# Patient Record
Sex: Male | Born: 1955 | Race: White | Hispanic: No | Marital: Married | Smoking: Never smoker
Health system: Southern US, Community
[De-identification: ages and names within clinical notes are randomized; demographics above are authoritative.]

## PROBLEM LIST (undated history)

## (undated) DIAGNOSIS — I10 Essential (primary) hypertension: Secondary | ICD-10-CM

## (undated) DIAGNOSIS — F32A Depression, unspecified: Secondary | ICD-10-CM

## (undated) DIAGNOSIS — E079 Disorder of thyroid, unspecified: Secondary | ICD-10-CM

## (undated) DIAGNOSIS — E785 Hyperlipidemia, unspecified: Secondary | ICD-10-CM

## (undated) DIAGNOSIS — F329 Major depressive disorder, single episode, unspecified: Secondary | ICD-10-CM

## (undated) HISTORY — PX: THUMB AMPUTATION: SHX804

## (undated) HISTORY — DX: Depression, unspecified: F32.A

## (undated) HISTORY — DX: Major depressive disorder, single episode, unspecified: F32.9

## (undated) HISTORY — DX: Essential (primary) hypertension: I10

## (undated) HISTORY — DX: Hyperlipidemia, unspecified: E78.5

## (undated) HISTORY — DX: Disorder of thyroid, unspecified: E07.9

---
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
## 2009-03-27 DIAGNOSIS — E785 Hyperlipidemia, unspecified: Secondary | ICD-10-CM | POA: Insufficient documentation

## 2009-03-27 DIAGNOSIS — F32A Depression, unspecified: Secondary | ICD-10-CM | POA: Insufficient documentation

## 2009-03-27 DIAGNOSIS — E782 Mixed hyperlipidemia: Secondary | ICD-10-CM | POA: Insufficient documentation

## 2010-05-31 HISTORY — PX: PROSTATE SURGERY: SHX751

## 2011-02-16 ENCOUNTER — Ambulatory Visit: Payer: Self-pay | Admitting: Urology

## 2011-02-17 ENCOUNTER — Ambulatory Visit: Payer: Self-pay | Admitting: Urology

## 2011-02-18 LAB — PATHOLOGY REPORT

## 2011-12-27 ENCOUNTER — Ambulatory Visit: Payer: Self-pay | Admitting: Otolaryngology

## 2011-12-29 LAB — PATHOLOGY REPORT

## 2012-09-13 ENCOUNTER — Ambulatory Visit: Payer: Self-pay | Admitting: Family Medicine

## 2012-09-13 LAB — HM HEPATITIS C SCREENING LAB: HM HEPATITIS C SCREENING: NEGATIVE

## 2013-06-04 ENCOUNTER — Ambulatory Visit: Payer: Self-pay | Admitting: Gastroenterology

## 2013-12-06 IMAGING — CT CT NECK WITH CONTRAST
1 of 2 series · 9 of 14 positions shown, 12 images · IV contrast (isovue)
Comparison: none

REASON FOR EXAM: Left Parotid  Mass   With FNA
COMMENTS:

PROCEDURE:     CT  - CT NECK WITH CONTRAST  - December 27, 2011  [DATE]
RESULT:     Comparison: None.
TECHNIQUE: Multiple axial images were obtained of the neck from the level of
the frontal sinuses through the aortic arch, after the administration of 75
mL Isovue 300 intravenous contrast.

[Series 2: soft tissue · axial · 0.50mm/px · z∈[-295,-37]mm · 9 of 108 slices shown, 12 images]
[im 11/108  soft-tissue]
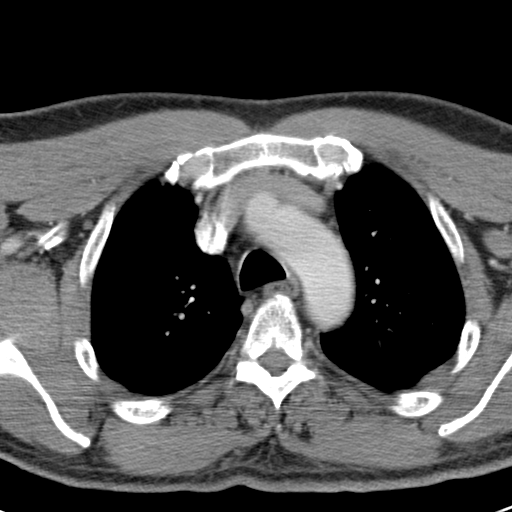
[im 11/108  bone]
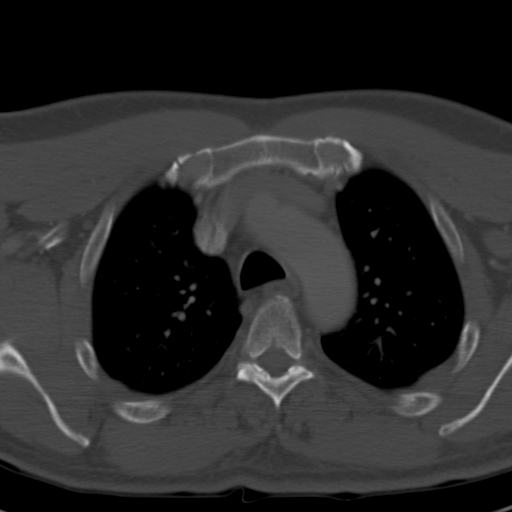
[im 22/108  bone]
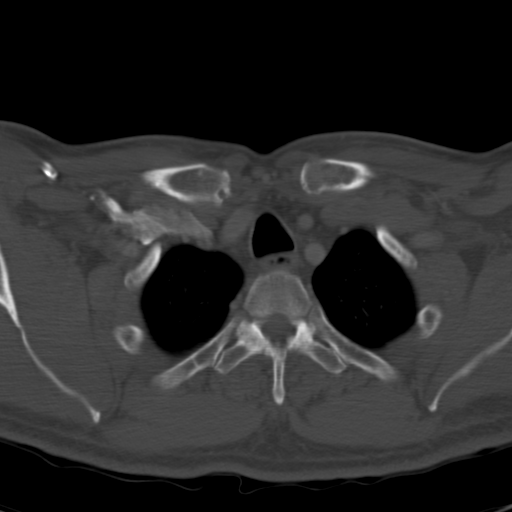
[im 33/108  bone]
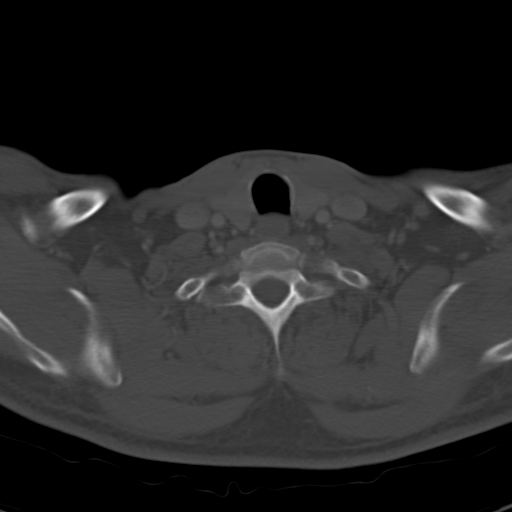
[im 43/108  bone]
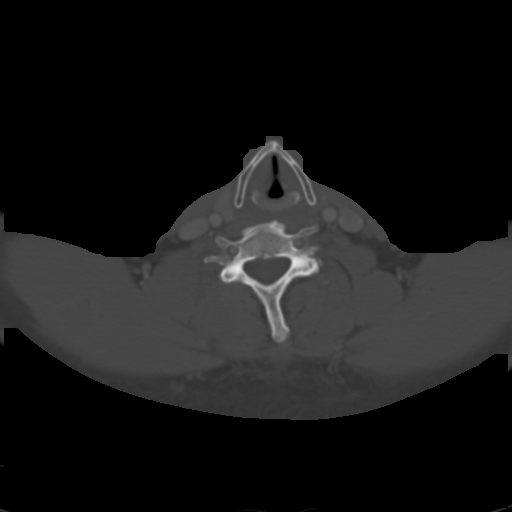
[im 54/108  soft-tissue]
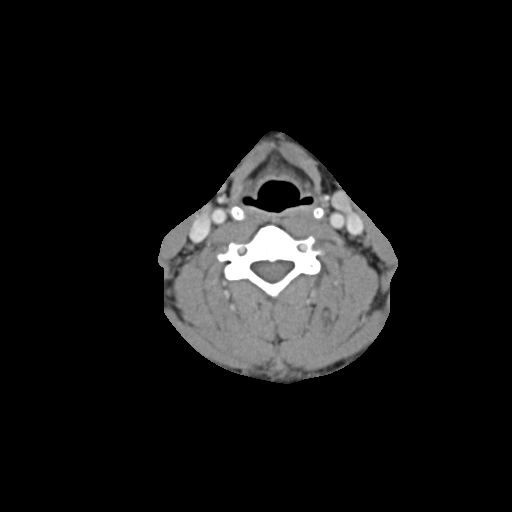
[im 54/108  bone]
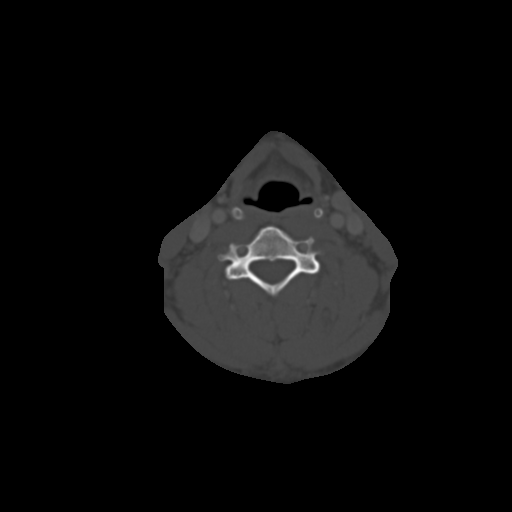
[im 65/108  bone]
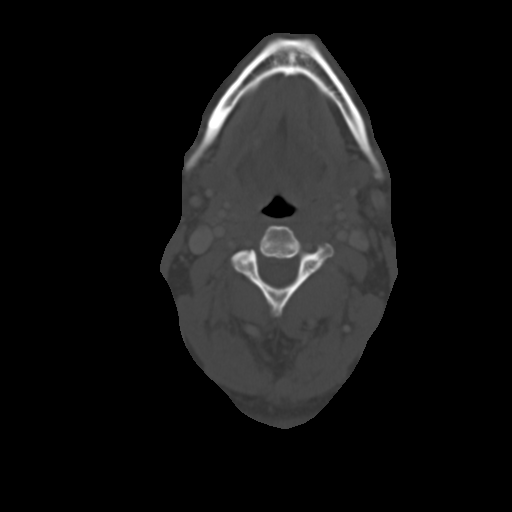
[im 75/108  bone]
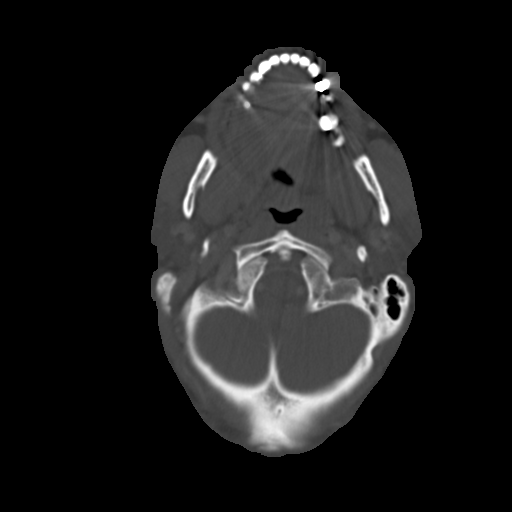
[im 86/108  bone]
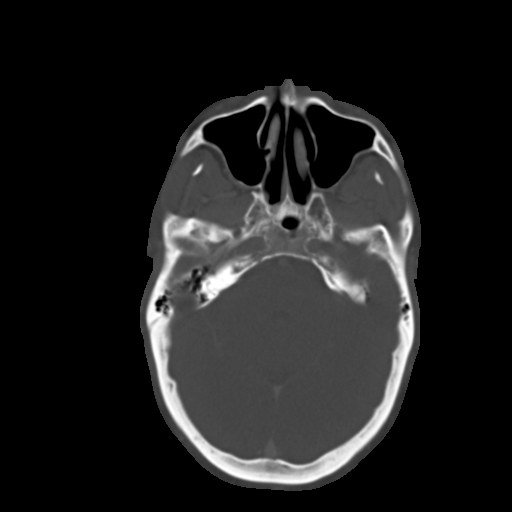
[im 97/108  soft-tissue]
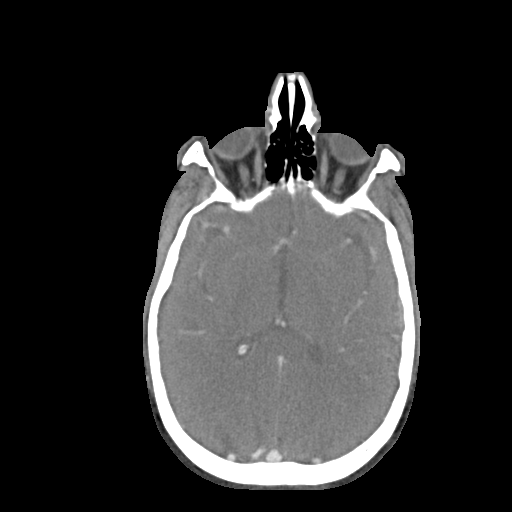
[im 97/108  bone]
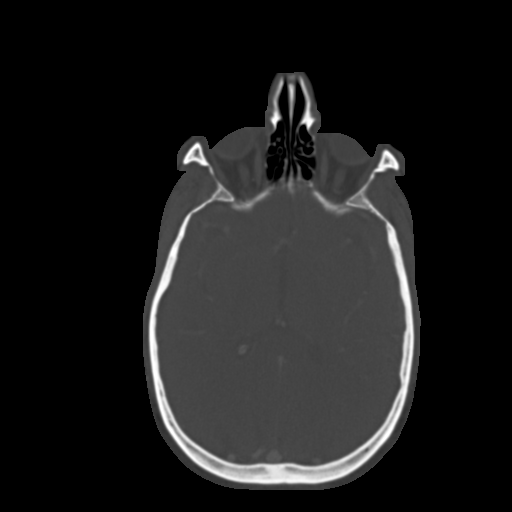

[9 of 14 positions shown; findings below may reference images not displayed]

FINDINGS: There is a small lobular area of increased density in the anterior aspect of
the left parotid gland. This may represent a small mass. It measured
approximately 1.1 x 0.8 cm in greatest axial dimension. There are a few
punctate densities along the posterior/inferior aspect which may represent
calcifications. This area is immediately inferior to the skin marker. There
is some streak artifact from dental amalgam hardware in this region.

No lymphadenopathy identified in the neck. There a mildly prominent, but
subcentimeter lymph node along the left carotid chain, just inferior to the
level of the mandible.

There is a 3 mm nodule in the left upper lobe, image 103.

No aggressive lytic or sclerotic osseous lesions identified. A sclerotic
density in the left third rib likely represents a bone island.
IMPRESSION: 1. Small indeterminate mass in the anterior aspect the left parotid gland,
with associated punctate calcifications. Attempt will be made for CT guided
FNA of this mass.
2. Indeterminate 3 mm nodule in the left upper lobe. If the patient is at
low risk for lung cancer, no further follow-up is recommended.  If the
patient is at high risk for lung cancer, recommend 12 month follow-up
noncontrast chest CT.

## 2013-12-06 IMAGING — CT CT ASPIRATION
2 series · 10 of 14 positions shown, 12 images · non-contrast
Comparison: none

REASON FOR EXAM: Left Parotid  Mass   With FNA
COMMENTS:

[Series 2: soft tissue · axial · 0.49mm/px · z∈[-550,-478]mm · 5 of 56 slices shown]
[im 10/56  soft-tissue]
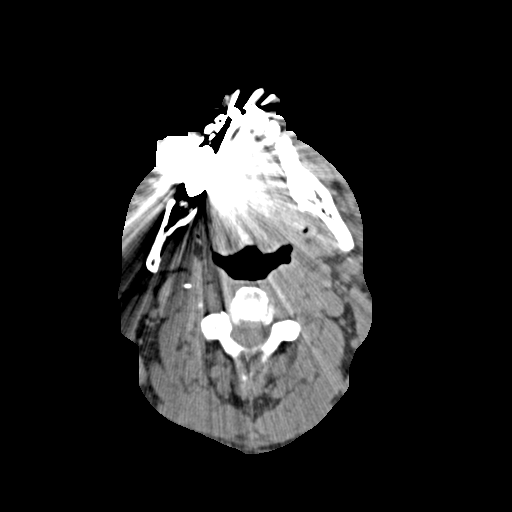
[im 19/56  soft-tissue]
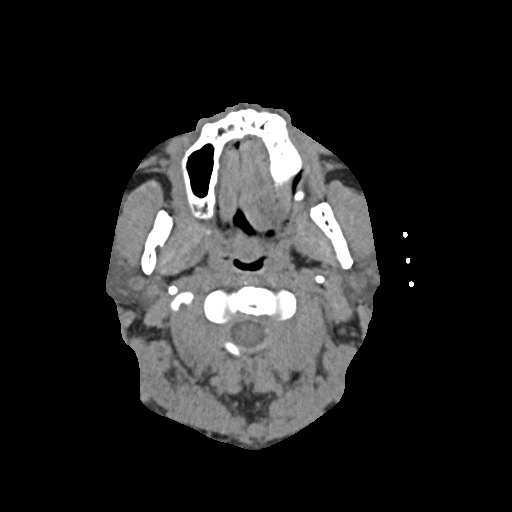
[im 28/56  soft-tissue]
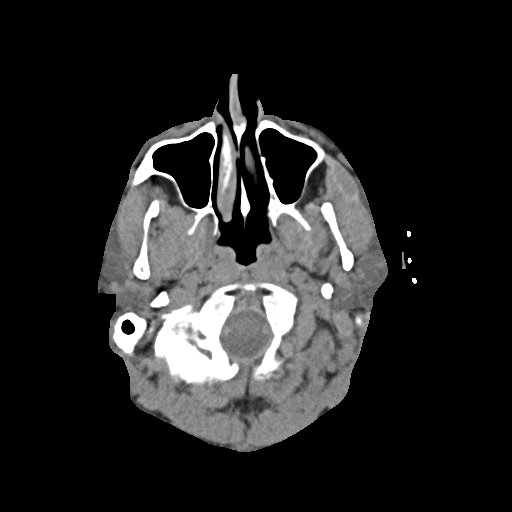
[im 37/56  soft-tissue]
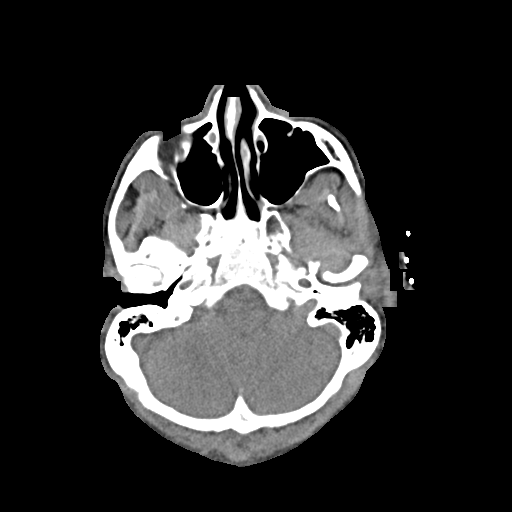
[im 46/56  soft-tissue]
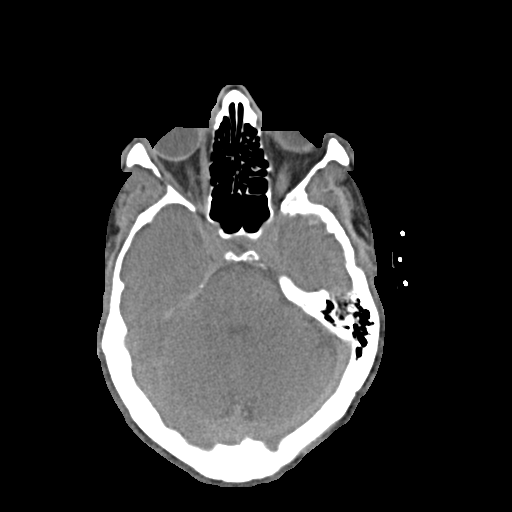

[Series 6: (hospital) 2.4 b30s · axial · 0.59mm/px · z∈[-508,-506]mm · 5 of 56 slices shown, 7 images]
[im 10/56  soft-tissue]
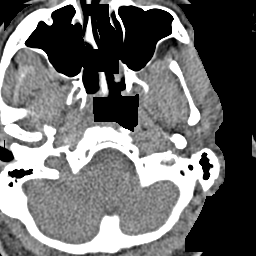
[im 10/56  bone]
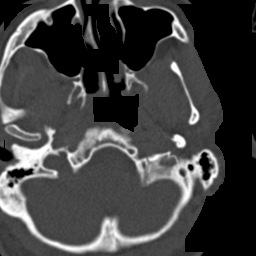
[im 19/56  bone]
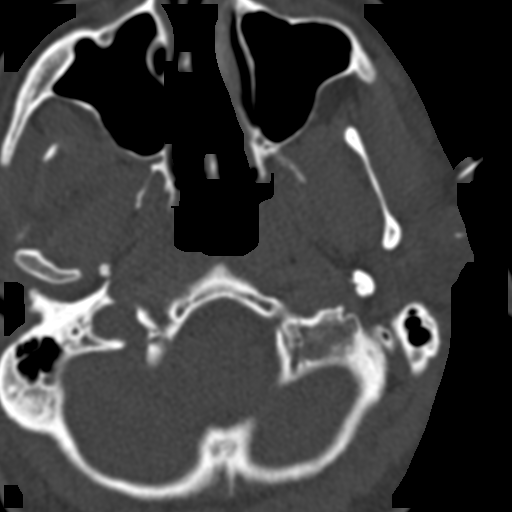
[im 28/56  bone]
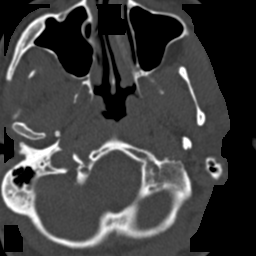
[im 37/56  bone]
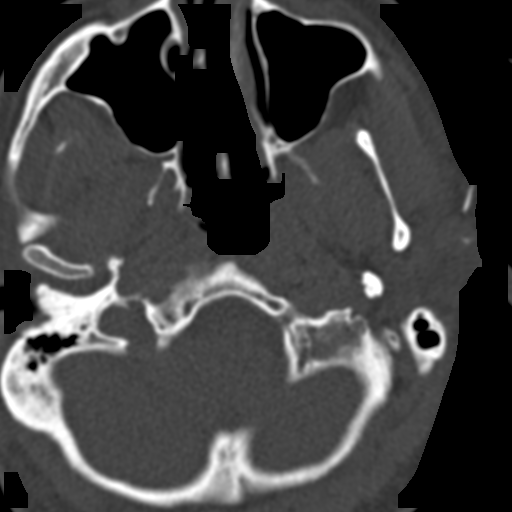
[im 46/56  soft-tissue]
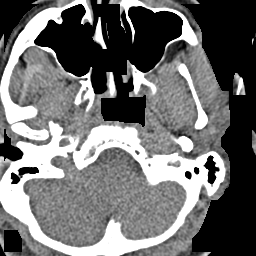
[im 46/56  bone]
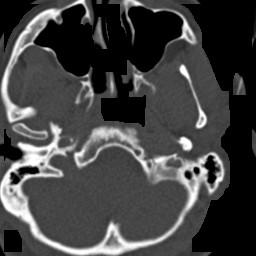

[10 of 14 positions shown; findings below may reference images not displayed]

PROCEDURE:     CT  - CT GUIDED BIOPSY or ASPIRATION  - December 27, 2011 [DATE]

RESULT:     Comparison: CT of the neck performed same day.

Technique and Findings:
After all risks and benefits were explained to the patient and all questions
were answered, informed consent was obtained. A limited CT was obtained of
the neck, with skin markers in place. The small mass in the left parotid
gland was localized and the skin was marked. Local anesthesia was provided
with approximately 1 mL of 1% lidocaine. A 25-gauge heparinized needle was
advanced to the site of the small mass in the anterior left parotid gland
under CT fluoroscopic guidance. Then, a fine needle aspirate was performed
at this site and given to the pathologist for review. An additional 25-gauge
heparinized needle was advanced into this mass by CT guidance and a fine
needle aspirate was again performed and given to the pathologist. The
patient tolerated the procedure well, without immediate application.
IMPRESSION: CT guided fine needle aspiration of the small mass in the anterior left
parotid gland. Pathology is pending.

## 2014-05-31 HISTORY — PX: OTHER SURGICAL HISTORY: SHX169

## 2014-06-05 ENCOUNTER — Ambulatory Visit: Payer: Self-pay | Admitting: Otolaryngology

## 2014-06-13 LAB — LIPID PANEL
Cholesterol: 211 mg/dL — AB (ref 0–200)
HDL: 50 mg/dL (ref 35–70)
LDL Cholesterol: 144 mg/dL
TRIGLYCERIDES: 85 mg/dL (ref 40–160)

## 2014-06-13 LAB — TSH: TSH: 0.7 u[IU]/mL (ref 0.41–5.90)

## 2014-06-13 LAB — HEPATIC FUNCTION PANEL
ALK PHOS: 66 U/L (ref 25–125)
ALT: 24 U/L (ref 10–40)
AST: 24 U/L (ref 14–40)
BILIRUBIN, TOTAL: 1.1 mg/dL

## 2014-06-13 LAB — CBC AND DIFFERENTIAL
HCT: 43 % (ref 41–53)
Hemoglobin: 14.5 g/dL (ref 13.5–17.5)
Neutrophils Absolute: 5 /uL
PLATELETS: 267 10*3/uL (ref 150–399)
WBC: 9.5 10^3/mL

## 2014-06-13 LAB — BASIC METABOLIC PANEL
BUN: 12 mg/dL (ref 4–21)
CREATININE: 1 mg/dL (ref 0.6–1.3)
Glucose: 97 mg/dL
Potassium: 5 mmol/L (ref 3.4–5.3)
Sodium: 143 mmol/L (ref 137–147)

## 2014-06-13 LAB — PSA: PSA: 6.6

## 2014-09-23 LAB — SURGICAL PATHOLOGY

## 2014-09-29 NOTE — Op Note (Signed)
PATIENT NAME:  Nathaniel GainerKELLY, Nathaniel Baker MR#:  [id number] DATE OF BIRTH:  [birth year]  DATE OF PROCEDURE:  06/05/2014  PREOPERATIVE DIAGNOSIS:  Left parotid neoplasm.   POSTOPERATIVE DIAGNOSIS:  Left parotid neoplasm.   PROCEDURE:  Left superficial parotidectomy with facial nerve dissection and facial nerve monitoring.   SURGEON:  Ollen Grossaul S. Willeen CassBennett, MD   FIRST ASSISTANT:  Vernie MurdersPaul Juengel ANESTHESIA:  General endotracheal.   INDICATIONS:  This is a 59 year old male with an enlarging left parotid mass, which appears to be benign by fine needle aspiration.   FINDINGS:  The patient had a 2.3 cm mass in the lower-to-mid left parotid gland.   COMPLICATIONS:  None.   DESCRIPTION OF PROCEDURE:  After obtaining informed consent, the patient was taken to the operating room and placed in the supine position. After induction of general endotracheal anesthesia, the patient was turned 90 degrees. Facial nerve monitor electrodes were placed in the usual fashion. Proper functioning of the nerve monitors was confirmed. The skin over the proposed incision line was then injected with 1% lidocaine with epinephrine 1:200,000. He was then prepped and draped in the usual sterile fashion. A 15 blade was used to incise the skin just in front of the ear, curving in an S-shaped incision down below the earlobe and into the neck. The incision was carried down to the subcutaneous tissues and the wound further opened up initially with the Bovie, exposing the anterior border of the sternocleidomastoid muscle. The Harmonic scalpel was used for later deeper dissection. Dissection proceeded superiorly from the sternocleidomastoid, dividing the fascia over the region of the mastoid bone and separating the parotid tissue away from the mastoid and the tragal cartilage. A skin flap was elevated with scissors to facilitate dissection and elevated over the anterior border of the palpable mass. Dissection then proceeded along the tragal cartilage working  in the area between the tragal cartilage and the mastoid tip, dividing soft tissue attachments with the Harmonic scalpel. The dissection proceeded down to the facial nerve, which was identified at the trunk. Dissection then proceeded along the facial nerve, dissecting out each facial nerve branch and dividing intervening parotid tissue, taking care to avoid injury to any of the facial nerve branches during the dissection. All branches were carefully dissected out, dissecting the parotid gland and the associated mass away from the facial nerve and retracting them laterally. The majority of the tissue was divided with the Harmonic scalpel, though some was performed with the bipolar cautery. Once we were completely around the mass, it was divided anteriorly with the Harmonic scalpel with facial nerve in full view, avoiding injury to any of the dissected branches. Minor bleeding was controlled with bipolar cautery. Stimulation of the facial nerve branches was confirmed. The wound was then vigorously irrigated with saline. A #10 TLS drain was placed in the wound bed and secured to the skin with a 4-0 Vicryl suture. The subcutaneous tissues were then closed with 4-0 Vicryl suture in an interrupted fashion. The skin was then closed with 5-0 Prolene in a running lock stitch. The drain was hooked up to suction, and the patient returned to the anesthesiologist for awakening. He was awakened and taken to the recovery room in good condition postoperatively. Blood loss was less than 25 mL.    ____________________________ Ollen GrossPaul S. Willeen CassBennett, MD psb:nb D: 06/05/2014 17:46:08 ET T: 06/06/2014 01:18:19 ET JOB#: [id number]  cc: Ollen GrossPaul S. Willeen CassBennett, MD, <Dictator> Sandi MealyPAUL S Chayla Shands MD ELECTRONICALLY SIGNED 06/18/2014 8:40

## 2014-12-18 ENCOUNTER — Other Ambulatory Visit: Payer: Self-pay | Admitting: Family Medicine

## 2014-12-18 DIAGNOSIS — J302 Other seasonal allergic rhinitis: Secondary | ICD-10-CM

## 2015-02-22 ENCOUNTER — Other Ambulatory Visit: Payer: Self-pay | Admitting: Family Medicine

## 2015-02-24 NOTE — Telephone Encounter (Signed)
Nathaniel Baker 

## 2015-03-12 ENCOUNTER — Other Ambulatory Visit: Payer: Self-pay | Admitting: Family Medicine

## 2015-03-19 ENCOUNTER — Emergency Department
Admission: EM | Admit: 2015-03-19 | Discharge: 2015-03-19 | Disposition: A | Discharge status: Home / Self Care | Payer: Worker's Compensation | Attending: Emergency Medicine | Admitting: Emergency Medicine

## 2015-03-19 ENCOUNTER — Emergency Department: Payer: Worker's Compensation

## 2015-03-19 DIAGNOSIS — M545 Low back pain: Secondary | ICD-10-CM | POA: Diagnosis present

## 2015-03-19 DIAGNOSIS — Z79899 Other long term (current) drug therapy: Secondary | ICD-10-CM | POA: Insufficient documentation

## 2015-03-19 DIAGNOSIS — M5416 Radiculopathy, lumbar region: Secondary | ICD-10-CM | POA: Insufficient documentation

## 2015-03-19 DIAGNOSIS — IMO0001 Reserved for inherently not codable concepts without codable children: Secondary | ICD-10-CM

## 2015-03-19 MED ORDER — OXYCODONE-ACETAMINOPHEN 7.5-325 MG PO TABS: 1.0000 | ORAL_TABLET | Freq: Four times a day (QID) | ORAL | Status: DC | PRN

## 2015-03-19 MED ORDER — METHOCARBAMOL 750 MG PO TABS: 1500.0000 mg | ORAL_TABLET | Freq: Four times a day (QID) | ORAL | Status: DC

## 2015-03-19 MED ORDER — METHYLPREDNISOLONE 4 MG PO TBPK: ORAL_TABLET | ORAL | Status: DC

## 2015-03-19 NOTE — ED Provider Notes (Signed)
[facility] Emergency Department Provider Note  ____________________________________________  Time seen: Approximately 9:31 AM  I have reviewed the triage vital signs and the nursing notes.   HISTORY  Chief Complaint Back Pain    HPI Nathaniel Baker is a 59 y.o. male patient complaining of acute onset of radicular low back pain to the right leg status post heavy list incident occurred at work today. Patient stated pain increases with standing and ambulation. Patient state pain decreases with sitting and remaining still. Patient denies any bladder or bowel dysfunction. Patient is rating his pain as 8/10. Patient stated no palliative measures taken for this complaint.   History reviewed. No pertinent past medical history.  There are no active problems to display for this patient.   History reviewed. No pertinent past surgical history.  Current Outpatient Rx  Name  Route  Sig  Dispense  Refill  . methocarbamol (ROBAXIN-750) 750 MG tablet   Oral   Take 2 tablets (1,500 mg total) by mouth 4 (four) times daily.   40 tablet   0   . methylPREDNISolone (MEDROL DOSEPAK) 4 MG TBPK tablet      Take Tapered dose as directed   21 tablet   0   . montelukast (SINGULAIR) 10 MG tablet      TAKE 1 TABLET EVERY DAY   30 tablet   1   . oxyCODONE-acetaminophen (PERCOCET) 7.5-325 MG tablet   Oral   Take 1 tablet by mouth every 6 (six) hours as needed for severe pain.   12 tablet   0     Allergies Review of patient's allergies indicates no known allergies.  History reviewed. No pertinent family history.  Social History Social History  Substance Use Topics  . Smoking status: Never Smoker   . Smokeless tobacco: None  . Alcohol Use: Yes    Review of Systems Constitutional: No fever/chills Eyes: No visual changes. ENT: No sore throat. Cardiovascular: Denies chest pain. Respiratory: Denies shortness of breath. Gastrointestinal: No abdominal pain.  No  nausea, no vomiting.  No diarrhea.  No constipation. Genitourinary: Negative for dysuria. Musculoskeletal: Positive for back pain. Skin: Negative for rash. Neurological: Negative for headaches, focal weakness or numbness. 10-point ROS otherwise negative.  ____________________________________________   PHYSICAL EXAM:  VITAL SIGNS: ED Triage Vitals  Enc Vitals Group     BP 03/19/15 0901 171/95 mmHg     Pulse Rate 03/19/15 0901 103     Resp 03/19/15 0901 16     Temp 03/19/15 0901 98.3 F (36.8 C)     Temp Source 03/19/15 0901 Oral     SpO2 03/19/15 0901 100 %     Weight 03/19/15 0901 195 lb (88.451 kg)     Height 03/19/15 0901  (1.753 m)     Head Cir --      Peak Flow --      Pain Score 03/19/15 0902 8     Pain Loc --      Pain Edu? --      Excl. in GC? --     Constitutional: Alert and oriented. Well appearing and in no acute distress. Eyes: Conjunctivae are normal. PERRL. EOMI. Head: Atraumatic. Nose: No congestion/rhinnorhea. Mouth/Throat: Mucous membranes are moist.  Oropharynx non-erythematous. Neck: No stridor.  No cervical spine tenderness to palpation. Hematological/Lymphatic/Immunilogical: No cervical lymphadenopathy. Cardiovascular: Normal rate, regular rhythm. Grossly normal heart sounds.  Good peripheral circulation. Respiratory: Normal respiratory effort.  No retractions. Lungs CTAB. Gastrointestinal: Soft and nontender.  No distention. No abdominal bruits. No CVA tenderness. Musculoskeletal: Patient sits and stands with heavy reliance on upper extremities. No spinal deformity. Patient decreased range of motion with extension and left lateral movements. Patient has a positive straight leg raise test is 70 with the right lower extremity. Neurologic:  Normal speech and language. No gross focal neurologic deficits are appreciated. No gait instability. Skin:  Skin is warm, dry and intact. No rash noted. Psychiatric: Mood and affect are normal. Speech and  behavior are normal.  ____________________________________________   LABS (all labs ordered are listed, but only abnormal results are displayed)  Labs Reviewed  POC URINE PREG, ED   ____________________________________________  EKG   ____________________________________________  RADIOLOGY  No acute findings on lumbar x-ray. There is some disc space narrowing between L4 and 5, bilateral kidney stones. An extensive prostate calcification. ____________________________________________   PROCEDURES  Procedure(s) performed: None  Critical Care performed: No  ____________________________________________   INITIAL IMPRESSION / ASSESSMENT AND PLAN / ED COURSE  Pertinent labs & imaging results that were available during my care of the patient were reviewed by me and considered in my medical decision making (see chart for details).  Acute right radicular back pain. Patient given prescription for prednisone, Percocet, and Robaxin. Patient given a work excuse for 2 days. Patient advised to follow-up with his PCP or return by ER if his condition worsens. ____________________________________________   FINAL CLINICAL IMPRESSION(S) / ED DIAGNOSES  Final diagnoses:  Radicular pain of right lower back      Joni ReiningRonald K Smith, PA-C 03/19/15 1035  Darien Ramusavid W Kaminski, MD 03/19/15 1531

## 2015-03-19 NOTE — ED Notes (Signed)
Patient works at [employer] and lifted a heavy piece of equipment and felt his back pop in the lower portion.  Since lower  back injury patient has numbness and pain shooting down right leg.  Patient is workman comp case.

## 2015-06-11 ENCOUNTER — Other Ambulatory Visit: Payer: Self-pay | Admitting: Family Medicine

## 2015-06-12 ENCOUNTER — Ambulatory Visit (INDEPENDENT_AMBULATORY_CARE_PROVIDER_SITE_OTHER): Payer: Managed Care, Other (non HMO) | Admitting: Physician Assistant

## 2015-06-12 ENCOUNTER — Encounter: Payer: Self-pay | Admitting: Physician Assistant

## 2015-06-12 VITALS — BP 146/90 | HR 84 | Temp 97.6°F | Resp 17 | Wt 197.0 lb

## 2015-06-12 DIAGNOSIS — E0590 Thyrotoxicosis, unspecified without thyrotoxic crisis or storm: Secondary | ICD-10-CM | POA: Insufficient documentation

## 2015-06-12 DIAGNOSIS — E559 Vitamin D deficiency, unspecified: Secondary | ICD-10-CM | POA: Insufficient documentation

## 2015-06-12 DIAGNOSIS — Z85819 Personal history of malignant neoplasm of unspecified site of lip, oral cavity, and pharynx: Secondary | ICD-10-CM | POA: Insufficient documentation

## 2015-06-12 DIAGNOSIS — Z85818 Personal history of malignant neoplasm of other sites of lip, oral cavity, and pharynx: Secondary | ICD-10-CM | POA: Insufficient documentation

## 2015-06-12 DIAGNOSIS — J40 Bronchitis, not specified as acute or chronic: Secondary | ICD-10-CM | POA: Diagnosis not present

## 2015-06-12 DIAGNOSIS — R05 Cough: Secondary | ICD-10-CM

## 2015-06-12 DIAGNOSIS — R000 Tachycardia, unspecified: Secondary | ICD-10-CM | POA: Insufficient documentation

## 2015-06-12 DIAGNOSIS — J0120 Acute ethmoidal sinusitis, unspecified: Secondary | ICD-10-CM

## 2015-06-12 DIAGNOSIS — R030 Elevated blood-pressure reading, without diagnosis of hypertension: Secondary | ICD-10-CM | POA: Insufficient documentation

## 2015-06-12 DIAGNOSIS — R972 Elevated prostate specific antigen [PSA]: Secondary | ICD-10-CM | POA: Insufficient documentation

## 2015-06-12 DIAGNOSIS — N400 Enlarged prostate without lower urinary tract symptoms: Secondary | ICD-10-CM | POA: Insufficient documentation

## 2015-06-12 DIAGNOSIS — R591 Generalized enlarged lymph nodes: Secondary | ICD-10-CM | POA: Insufficient documentation

## 2015-06-12 DIAGNOSIS — R599 Enlarged lymph nodes, unspecified: Secondary | ICD-10-CM | POA: Insufficient documentation

## 2015-06-12 DIAGNOSIS — T63481A Toxic effect of venom of other arthropod, accidental (unintentional), initial encounter: Secondary | ICD-10-CM | POA: Insufficient documentation

## 2015-06-12 DIAGNOSIS — I8390 Asymptomatic varicose veins of unspecified lower extremity: Secondary | ICD-10-CM | POA: Insufficient documentation

## 2015-06-12 DIAGNOSIS — R059 Cough, unspecified: Secondary | ICD-10-CM

## 2015-06-12 DIAGNOSIS — J309 Allergic rhinitis, unspecified: Secondary | ICD-10-CM | POA: Insufficient documentation

## 2015-06-12 MED ORDER — HYDROCODONE-HOMATROPINE 5-1.5 MG/5ML PO SYRP: 5.0000 mL | ORAL_SOLUTION | Freq: Three times a day (TID) | ORAL | Status: DC | PRN

## 2015-06-12 MED ORDER — AMOXICILLIN-POT CLAVULANATE 875-125 MG PO TABS: 1.0000 | ORAL_TABLET | Freq: Two times a day (BID) | ORAL | Status: DC

## 2015-06-12 MED ORDER — PREDNISONE 10 MG (21) PO TBPK: ORAL_TABLET | ORAL | Status: DC

## 2015-06-12 NOTE — Progress Notes (Signed)
Patient: Nathaniel Baker Male    DOB: [birth year]   60 y.o.   MRN: [id number] Visit Date: 06/12/2015  Today's Provider: Margaretann LovelessJennifer M Burnette, PA-C   Chief Complaint  Patient presents with  . URI   Subjective:    URI  This is a new problem. The current episode started 1 to 4 weeks ago. The problem has been gradually worsening. There has been no fever. Associated symptoms include congestion, coughing, headaches, a plugged ear sensation, rhinorrhea, sinus pain, sneezing, a sore throat (itching) and wheezing. Pertinent negatives include no abdominal pain, chest pain, ear pain, joint swelling, nausea, neck pain or vomiting. He has tried decongestant for the symptoms. The treatment provided no relief.      Allergies  Allergen Reactions  . Yellow Jacket Venom    Previous Medications   CHOLECALCIFEROL (VITAMIN D) 1000 UNITS TABLET    Take by mouth.   FLUTICASONE (FLONASE) 50 MCG/ACT NASAL SPRAY    Place into the nose.   MELOXICAM (MOBIC) 7.5 MG TABLET    Take 7.5 mg by mouth daily.   MONTELUKAST (SINGULAIR) 10 MG TABLET    TAKE 1 TABLET EVERY DAY   MULTIPLE VITAMIN PO    Take by mouth.   TRAMADOL (ULTRAM) 50 MG TABLET    TAKE 1-2 TABLETS BY MOUTH EVERY EIGHT HOURS AS NEEDED**WORKER'S COMP**    Review of Systems  Constitutional: Negative for fever, chills and fatigue.  HENT: Positive for congestion, postnasal drip, rhinorrhea, sinus pressure, sneezing, sore throat (itching) and voice change. Negative for ear pain.   Eyes: Negative.   Respiratory: Positive for cough, shortness of breath and wheezing. Negative for chest tightness.   Cardiovascular: Negative for chest pain.  Gastrointestinal: Negative for nausea, vomiting and abdominal pain.  Musculoskeletal: Negative for neck pain.  Neurological: Positive for dizziness and headaches.  All other systems reviewed and are negative.   Social History  Substance Use Topics  . Smoking status: Never Smoker   . Smokeless tobacco: Not  on file  . Alcohol Use: Yes   Objective:   BP 146/90 mmHg  Pulse 84  Temp(Src) 97.6 F (36.4 C) (Oral)  Resp 17  Wt 197 lb (89.359 kg)  SpO2 97%  Physical Exam  Constitutional: He appears well-developed and well-nourished. No distress.  HENT:  Head: Normocephalic and atraumatic.  Right Ear: Hearing, external ear and ear canal normal. Tympanic membrane is not erythematous and not bulging. A middle ear effusion is present.  Left Ear: Hearing, external ear and ear canal normal. Tympanic membrane is not erythematous and not bulging. A middle ear effusion is present.  Nose: Mucosal edema and rhinorrhea present. Right sinus exhibits frontal sinus tenderness. Right sinus exhibits no maxillary sinus tenderness. Left sinus exhibits frontal sinus tenderness. Left sinus exhibits no maxillary sinus tenderness.  Mouth/Throat: Uvula is midline, oropharynx is clear and moist and mucous membranes are normal. No oropharyngeal exudate, posterior oropharyngeal edema or posterior oropharyngeal erythema.  Eyes: Conjunctivae and EOM are normal. Pupils are equal, round, and reactive to light. Right eye exhibits no discharge. Left eye exhibits no discharge.  Neck: Normal range of motion. Neck supple. No JVD present. No tracheal deviation present. No Brudzinski's sign and no Kernig's sign noted. No thyromegaly present.  Cardiovascular: Normal rate, regular rhythm and normal heart sounds.  Exam reveals no gallop and no friction rub.   No murmur heard. Pulmonary/Chest: Effort normal and breath sounds normal. No stridor. No respiratory distress. He has  no decreased breath sounds. He has no wheezes (Very tight sounds without wheeze). He has no rales. He exhibits no tenderness.  Lymphadenopathy:    He has no cervical adenopathy.  Skin: Skin is warm and dry. He is not diaphoretic.  Vitals reviewed.       Assessment & Plan:     1. Acute ethmoidal sinusitis, recurrence not specified Worsening symptoms x 2 weeks  without relief from OTC medications. Will treat with augmentin as below.  He may take Mucinex DM for congestion. He needs to stay well hydrated and get plenty of rest. He is to call the office if symptoms do not improve with treatment. - amoxicillin-clavulanate (AUGMENTIN) 875-125 MG tablet; Take 1 tablet by mouth 2 (two) times daily.  Dispense: 20 tablet; Refill: 0  2. Bronchitis Worsening symptoms and nighttime cough.  Will add prednisone for inflammation and cough. I did advise that sometimes a residual dry cough remains following bronchitis and this is common.  Cough drops and staying well hydrated will help. He is to call the office if symptoms fail to improve or worsen. - amoxicillin-clavulanate (AUGMENTIN) 875-125 MG tablet; Take 1 tablet by mouth 2 (two) times daily.  Dispense: 20 tablet; Refill: 0 - predniSONE (STERAPRED UNI-PAK 21 TAB) 10 MG (21) TBPK tablet; Take as directed on package directions.  Dispense: 21 tablet; Refill: 0 - HYDROcodone-homatropine (HYCODAN) 5-1.5 MG/5ML syrup; Take 5 mLs by mouth every 8 (eight) hours as needed for cough.  Dispense: 180 mL; Refill: 0  3. Cough Worsening nighttime cough.  Hycodan cough syrup given as below. He is to call the office if symptoms fail to improve or worsen. - HYDROcodone-homatropine (HYCODAN) 5-1.5 MG/5ML syrup; Take 5 mLs by mouth every 8 (eight) hours as needed for cough.  Dispense: 180 mL; Refill: 0       Margaretann Loveless, PA-C  [facility]

## 2015-06-24 ENCOUNTER — Ambulatory Visit (INDEPENDENT_AMBULATORY_CARE_PROVIDER_SITE_OTHER): Payer: Managed Care, Other (non HMO) | Admitting: Family Medicine

## 2015-06-24 ENCOUNTER — Encounter: Payer: Self-pay | Admitting: Family Medicine

## 2015-06-24 VITALS — BP 130/80 | HR 72 | Temp 97.6°F | Resp 16 | Ht 68.0 in | Wt 197.8 lb

## 2015-06-24 DIAGNOSIS — E7800 Pure hypercholesterolemia, unspecified: Secondary | ICD-10-CM

## 2015-06-24 DIAGNOSIS — M4316 Spondylolisthesis, lumbar region: Secondary | ICD-10-CM | POA: Diagnosis not present

## 2015-06-24 DIAGNOSIS — N400 Enlarged prostate without lower urinary tract symptoms: Secondary | ICD-10-CM

## 2015-06-24 DIAGNOSIS — Z0000 Encounter for general adult medical examination without abnormal findings: Secondary | ICD-10-CM | POA: Diagnosis not present

## 2015-06-24 LAB — POCT URINALYSIS DIPSTICK
BILIRUBIN UA: NEGATIVE
Glucose, UA: NEGATIVE
LEUKOCYTES UA: NEGATIVE
Nitrite, UA: NEGATIVE
PH UA: 7.5
PROTEIN UA: NEGATIVE
RBC UA: NEGATIVE
SPEC GRAV UA: 1.01
Urobilinogen, UA: 0.2

## 2015-06-24 NOTE — Progress Notes (Signed)
Patient ID: Nathaniel Baker, male   DOB: [birth year], 60 y.o.   MRN: [id number]       Patient: Nathaniel Baker, Male    DOB: 11/21/1955, 60 y.o.   MRN: [id number] Visit Date: 06/24/2015  Today's Provider: [name], PA   Chief Complaint  Patient presents with  . Annual Exam   Subjective:    Annual physical exam Nathaniel Baker is a 60 y.o. male who presents today for health maintenance and complete physical. He feels fairly well. He reports exercising none due to back injury. He reports he is sleeping well (average 7 hours per night).  ----------------------------------------------------------------- Tdap: 06/07/2011 [facility]  Review of Systems  Constitutional: Negative.   HENT: Negative.   Eyes: Negative.   Respiratory: Negative.   Cardiovascular: Negative.   Gastrointestinal: Negative.   Endocrine: Negative.   Genitourinary: Negative.   Musculoskeletal: Positive for back pain.  Skin: Negative.   Allergic/Immunologic: Negative.   Neurological: Negative.   Hematological: Negative.   Psychiatric/Behavioral: Negative.     Social History      He  reports that he has never smoked. He does not have any smokeless tobacco history on file. He reports that he drinks alcohol. He reports that he does not use illicit drugs.       Social History   Social History  . Marital Status: Married    Spouse Name: N/A  . Number of Children: N/A  . Years of Education: N/A   Social History Main Topics  . Smoking status: Never Smoker   . Smokeless tobacco: None  . Alcohol Use: Yes  . Drug Use: No  . Sexual Activity: Not Asked   Other Topics Concern  . None   Social History Narrative    Past Medical History  Diagnosis Date  . Hyperlipidemia   . Depression   . Thyroid disease   . Hypertension      Patient Active Problem List   Diagnosis Date Noted  . Elevated prostate specific antigen (PSA) 06/12/2015  . Insect sting 06/12/2015  . Allergic  rhinitis 06/12/2015  . 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 Elevated blood-pressure reading without diagnosis of hypertension 06/12/2015  . Tachycardia 06/12/2015  . History of throat cancer 06/12/2015  . Hyperthyroidism 06/12/2015  . Adenopathy 06/12/2015  . 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 Avitaminosis D 06/12/2015  . 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 Clinical depression 03/27/2009  . Hypercholesterolemia without hypertriglyceridemia 03/27/2009    Past Surgical History  Procedure Laterality Date  . Tumor removed Left 05/2014  . Prostate surgery  2012    Biopsy x2  . Tonsillectomy  1965    Family History        Family Status  Relation Status Death Age  . Sister Alive   . Mother Deceased 67    cause of death was MI  . Father Deceased 37     cause of death was internal bleeding after surgery  . Brother Alive     Melanoma, prostate cancer  . Maternal Grandfather Deceased     Lung cancer. Heavy smoker  . Maternal Grandmother Deceased   . Brother Alive         His family history includes Depression in his sister; Diabetes in his brother and sister; Heart attack in his mother; Lung cancer in his maternal grandmother; Melanoma in his brother; Prostate cancer in his brother.  Allergies  Allergen Reactions  . Yellow Jacket Venom     Previous Medications   CHOLECALCIFEROL (VITAMIN D) 1000 UNITS TABLET    Take by mouth.   FLUTICASONE (FLONASE) 50 MCG/ACT NASAL SPRAY    Place into the nose.   MONTELUKAST (SINGULAIR) 10 MG TABLET    TAKE 1 TABLET EVERY DAY   MULTIPLE VITAMIN PO    Take by mouth.   TRAMADOL (ULTRAM) 50 MG TABLET    TAKE 1-2 TABLETS BY MOUTH EVERY EIGHT HOURS AS NEEDED**WORKER'S COMP**    Patient Care Team: Tamsen Roers, PA as PCP - General (Family Medicine)     Objective:   Vitals: BP 130/80 mmHg  Pulse 72  Temp(Src) 97.6 F (36.4 C) (Oral)  Resp 16  Ht  (1.727 m)  Wt 197  lb 12.8 oz (89.721 kg)  BMI 30.08 kg/m2   Physical Exam  Constitutional: He is oriented to person, place, and time. He appears well-developed and well-nourished.  HENT:  Head: Normocephalic and atraumatic.  Right Ear: External ear normal.  Left Ear: External ear normal.  Nose: Nose normal.  Mouth/Throat: Oropharynx is clear and moist.  Eyes: Conjunctivae and EOM are normal. Pupils are equal, round, and reactive to light. Right eye exhibits no discharge.  Neck: Normal range of motion. Neck supple. No tracheal deviation present. No thyromegaly present.  Cardiovascular: Normal rate, regular rhythm, normal heart sounds and intact distal pulses.   No murmur heard. Pulmonary/Chest: Effort normal and breath sounds normal. No respiratory distress. He has no wheezes. He has no rales. He exhibits no tenderness.  Abdominal: Soft. He exhibits no distension and no mass. There is no tenderness. There is no rebound and no guarding.  Genitourinary:  Unable to accomplish DRE due to back pain.  Musculoskeletal: He exhibits tenderness. He exhibits no edema.  Persistent pain in lumbar spine due to spondylolisthesis and W/C injury in October 2016. Weak dorsiflexion and plantar flexion of the right foot. DTR's symmetric ankle and knee.  Lymphadenopathy:    He has no cervical adenopathy.  Neurological: He is alert and oriented to person, place, and time. He has normal reflexes. No cranial nerve deficit. He exhibits normal muscle tone. Coordination normal.  Skin: Skin is warm and dry. No rash noted. No erythema.  Psychiatric: He has a normal mood and affect. His behavior is normal. Judgment and thought content normal.   Depression Screen Sad with slightly dull affect. 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 Still in physical therapy and will have follow up with neurosurgeon in 1 week.    Assessment & Plan:     Routine Health Maintenance and Physical  Exam  Exercise Activities and Dietary recommendations Goals    Unable to exercise because of back injury with spondylolisthesis and unable to lift more than 5 lbs.      Immunization History  Administered Date(s) Administered  . Influenza-Unspecified 03/25/2015  . Tdap 06/07/2011    Health Maintenance  Topic Date Due  . Hepatitis C Screening  [birth year]  . HIV Screening  03/09/1970  . ZOSTAVAX  03/10/2015  . INFLUENZA VACCINE  12/30/2015  . TETANUS/TDAP  06/06/2021  . COLONOSCOPY  06/05/2023      Discussed health benefits of physical activity, and encouraged him to engage in regular exercise appropriate for his age and condition.    -------------------------------------------------------------------- 1. Annual physical exam General health stable. Having persistent back pain since injury at work  in October 2016. Followed by W/C doctor and neurosurgeon. Last colonoscopy was 06-04-14 and Tdap 06-07-11. Anticipatory guidance given.  - POCT urinalysis dipstick  2. Spondylolisthesis of lumbar region Chronic pain in lower back since W/C accident at work in October 2016. Describes spondylolisthesis and will continue physical therapy and follow up with neurosurgeon next week.  3. Benign fibroma of prostate Diagnosed by Dr. Lonna Cobb by 2 biopsies in 2012. Was treating BPH symptoms with Nathaniel Baker. Unable to examine today due to sever back pain. - PSA  4. Hypercholesterolemia without hypertriglyceridemia History of elevation of cholesterol and triglycerides. Trying to follow low fat diet but unable to exercise due to back pain. Will recheck labs and follow up pending reports. - CBC with Differential/Platelet - COMPLETE METABOLIC PANEL WITH GFR - Lipid panel - TSH

## 2015-06-24 NOTE — Patient Instructions (Signed)

## 2015-06-25 LAB — COMPREHENSIVE METABOLIC PANEL
ALBUMIN: 4.4 g/dL (ref 3.6–4.8)
ALK PHOS: 61 IU/L (ref 39–117)
ALT: 27 IU/L (ref 0–44)
AST: 25 IU/L (ref 0–40)
Albumin/Globulin Ratio: 1.6 (ref 1.1–2.5)
BUN/Creatinine Ratio: 14 (ref 10–22)
BUN: 13 mg/dL (ref 8–27)
Bilirubin Total: 0.8 mg/dL (ref 0.0–1.2)
CALCIUM: 9.3 mg/dL (ref 8.6–10.2)
CO2: 24 mmol/L (ref 18–29)
CREATININE: 0.96 mg/dL (ref 0.76–1.27)
Chloride: 102 mmol/L (ref 96–106)
GFR calc Af Amer: 99 mL/min/{1.73_m2} (ref >59)
GFR, EST NON AFRICAN AMERICAN: 86 mL/min/{1.73_m2} (ref >59)
GLOBULIN, TOTAL: 2.7 g/dL (ref 1.5–4.5)
GLUCOSE: 91 mg/dL (ref 65–99)
Potassium: 4.7 mmol/L (ref 3.5–5.2)
Sodium: 143 mmol/L (ref 134–144)
Total Protein: 7.1 g/dL (ref 6.0–8.5)

## 2015-06-25 LAB — LIPID PANEL
CHOL/HDL RATIO: 4.6 ratio (ref 0.0–5.0)
Cholesterol, Total: 249 mg/dL — ABNORMAL HIGH (ref 100–199)
HDL: 54 mg/dL (ref >39)
LDL Calculated: 179 mg/dL — ABNORMAL HIGH (ref 0–99)
TRIGLYCERIDES: 81 mg/dL (ref 0–149)
VLDL Cholesterol Cal: 16 mg/dL (ref 5–40)

## 2015-06-25 LAB — CBC WITH DIFFERENTIAL/PLATELET
BASOS ABS: 0 10*3/uL (ref 0.0–0.2)
Basos: 0 %
EOS (ABSOLUTE): 0.4 10*3/uL (ref 0.0–0.4)
Eos: 5 %
HEMATOCRIT: 45.1 % (ref 37.5–51.0)
Hemoglobin: 15.2 g/dL (ref 12.6–17.7)
IMMATURE GRANULOCYTES: 0 %
Immature Grans (Abs): 0 10*3/uL (ref 0.0–0.1)
LYMPHS ABS: 2.6 10*3/uL (ref 0.7–3.1)
Lymphs: 29 %
MCH: 27.4 pg (ref 26.6–33.0)
MCHC: 33.7 g/dL (ref 31.5–35.7)
MCV: 81 fL (ref 79–97)
MONOS ABS: 0.7 10*3/uL (ref 0.1–0.9)
Monocytes: 7 %
NEUTROS PCT: 59 %
Neutrophils Absolute: 5.2 10*3/uL (ref 1.4–7.0)
PLATELETS: 265 10*3/uL (ref 150–379)
RBC: 5.54 x10E6/uL (ref 4.14–5.80)
RDW: 14.1 % (ref 12.3–15.4)
WBC: 9 10*3/uL (ref 3.4–10.8)

## 2015-06-25 LAB — TSH: TSH: 0.547 u[IU]/mL (ref 0.450–4.500)

## 2015-06-25 LAB — PSA: Prostate Specific Ag, Serum: 7.7 ng/mL — ABNORMAL HIGH (ref 0.0–4.0)

## 2015-06-30 ENCOUNTER — Telehealth: Payer: Self-pay

## 2015-06-30 NOTE — Telephone Encounter (Signed)
-----   Message from Tamsen Roers, [place] sent at 06/27/2015  6:36 PM EST ----- All blood tests essentially normal except total cholesterol and LDL elevated. Need low fat diet and Pravastatin 20 mg qd #30 & 3 RF. PSA a little higher this year (was 6.6 in 2016). Looks like Dr. Lonna Cobb recommended Monico Blitz for prostate enlargement to help with any voiding issues and help shrink the prostate size. Can send report to Dr. Lonna Cobb if patient would like to consult him.

## 2015-06-30 NOTE — Telephone Encounter (Signed)
LMTCB

## 2015-07-03 MED ORDER — PRAVASTATIN SODIUM 20 MG PO TABS: 20.0000 mg | ORAL_TABLET | Freq: Every day | ORAL | Status: DC

## 2015-07-03 NOTE — Telephone Encounter (Signed)
Patient advised as directed below. Patient verbalized udnerstanding adn agrees with plan of care. Patient agrees to start taking Jalyn. [facility]acy- [facility] [address] Pravastatin sent to pharmacy.

## 2015-07-04 ENCOUNTER — Encounter: Payer: Self-pay | Admitting: Family Medicine

## 2015-07-08 MED ORDER — DUTASTERIDE-TAMSULOSIN HCL 0.5-0.4 MG PO CAPS: 1.0000 | ORAL_CAPSULE | Freq: Every day | ORAL | Status: DC

## 2015-07-08 NOTE — Telephone Encounter (Signed)
Refill of Jalyn sent to [facility] [address].

## 2015-07-09 NOTE — Telephone Encounter (Signed)
Patient advised RX has been sent to pharmacy  

## 2015-11-11 ENCOUNTER — Ambulatory Visit (INDEPENDENT_AMBULATORY_CARE_PROVIDER_SITE_OTHER): Payer: Managed Care, Other (non HMO) | Admitting: Family Medicine

## 2015-11-11 ENCOUNTER — Encounter: Payer: Self-pay | Admitting: Family Medicine

## 2015-11-11 VITALS — BP 140/90 | HR 82 | Temp 97.8°F | Resp 16 | Wt 195.2 lb

## 2015-11-11 DIAGNOSIS — M5416 Radiculopathy, lumbar region: Secondary | ICD-10-CM

## 2015-11-11 DIAGNOSIS — M5417 Radiculopathy, lumbosacral region: Secondary | ICD-10-CM | POA: Diagnosis not present

## 2015-11-11 DIAGNOSIS — F4323 Adjustment disorder with mixed anxiety and depressed mood: Secondary | ICD-10-CM | POA: Diagnosis not present

## 2015-11-11 DIAGNOSIS — Z23 Encounter for immunization: Secondary | ICD-10-CM | POA: Diagnosis not present

## 2015-11-11 NOTE — Progress Notes (Signed)
Patient: Nathaniel Baker Male    DOB: [birth year]   60 y.o.   MRN: [id number] Visit Date: 11/11/2015  Today's Provider: [name], PA   Chief Complaint  Patient presents with  . Medication Refill  . Shingles Vaccine   Subjective:    HPI Patient is here today for medication refill and shingle vaccine. Daughter is pregnant and due for delivery in March 17, 2016. Unsure if he wants the shingles vaccination, yet.He needs refill on the Tramadol 50 MG he needs it for his back pain/Injury that he had 7 months ago. W/C injury on the job October 2016 and finally found he has a "fracture on MRI 10 weeks latter". He reports that lately he has been feeling anxious/nervous, irritable, feeling depressed and worry about back and pain.  Past Medical History  Diagnosis Date  . Hyperlipidemia   . Depression   . Thyroid disease   . Hypertension    Past Surgical History  Procedure Laterality Date  . Tumor removed Left 05/2014  . Prostate surgery  2012    Biopsy x2  . Tonsillectomy  1965   Family History  Problem Relation Age of Onset  . Diabetes Sister   . Depression Sister   . Heart attack Mother   . Prostate cancer Brother   . Melanoma Brother   . Lung cancer Maternal Grandmother   . Diabetes Brother    Allergies  Allergen Reactions  . Yellow Jacket Venom    Current Meds  Medication Sig  . cholecalciferol (VITAMIN D) 1000 units tablet Take by mouth.  Marland Kitchen KRILL OIL PO Take by mouth.  . MULTIPLE VITAMIN PO Take by mouth.  . traMADol (ULTRAM) 50 MG tablet TAKE 1-2 TABLETS BY MOUTH EVERY EIGHT HOURS AS NEEDED**WORKER'S COMP**    Review of Systems  Constitutional: Negative.   Respiratory: Negative.   Cardiovascular: Negative for chest pain, palpitations and leg swelling.  Gastrointestinal: Negative.   Musculoskeletal: Positive for back pain.  Neurological: Negative for dizziness and headaches.  Psychiatric/Behavioral: The patient is nervous/anxious.     Social  History  Substance Use Topics  . Smoking status: Never Smoker   . Smokeless tobacco: Not on file  . Alcohol Use: Yes   Objective:   BP 140/90 mmHg  Pulse 82  Temp(Src) 97.8 F (36.6 C) (Oral)  Resp 16  Wt 195 lb 3.2 oz (88.542 kg)  Physical Exam  Constitutional: He is oriented to person, place, and time. He appears well-developed and well-nourished.  HENT:  Head: Normocephalic.  Eyes: Conjunctivae are normal.  Neck: Neck supple.  Cardiovascular: Normal rate and regular rhythm.   Pulmonary/Chest: Effort normal and breath sounds normal.  Abdominal: Soft. Bowel sounds are normal.  Musculoskeletal: He exhibits tenderness.  Decreased ROM of low back due to W/C injury October 2016. SLR's causing pain in right low back into the right thigh at 80 degrees. Tingling numbness in right lateral thigh into the toes of the right foot.  Neurological: He is alert and oriented to person, place, and time.  Reflex Scores:      Patellar reflexes are 0 on the right side and 2+ on the left side. Psychiatric: His speech is normal. Judgment and thought content normal. His mood appears anxious. He is agitated. Cognition and memory are normal. He exhibits a depressed mood.      Assessment & Plan:     1. 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 Continue follow up with W/C (Dr. Yetta BarreJones) and has finished physical therapy. Still having low back pain and states MRI showed a "fracture". Neurosurgeon did not feel he needed surgery yet. 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 Continues to use Tramadol intermittently and will try to get refills through W/C provider first.  2. Adjustment reaction with anxiety and depression States wife has noticed some agitation and irritability. Denies suicidal ideation. Sleep disturbance is from back pain. Does not want  to start any other medications right now. May consider counseling and follow up prn.  3. Need for zoster vaccine Wants to postpone vaccination until daughter delivers in October 2017. Worried about transmission of virus to pregnant daughter or infant.     Dortha Kernennis Chrismon, PA  [facility]

## 2015-12-03 ENCOUNTER — Other Ambulatory Visit: Payer: Self-pay | Admitting: Family Medicine

## 2015-12-03 NOTE — Telephone Encounter (Signed)
Pt contacted office for refill request on the following medications: traMADol (ULTRAM) 50 MG tablet [facility] [address] Pt stated that when he was in the office for an OV on 11/11/15 with Maurine Ministerennis he was advised to call when he needs a refill on the Tramadol. Please advise. Thanks TNP

## 2015-12-04 MED ORDER — TRAMADOL HCL 50 MG PO TABS: ORAL_TABLET | ORAL | Status: DC

## 2015-12-04 NOTE — Telephone Encounter (Signed)
[facility] 

## 2015-12-16 ENCOUNTER — Telehealth: Payer: Self-pay

## 2015-12-16 NOTE — Telephone Encounter (Signed)
Pt stated that when he was here for his OV on 11/11/15 he discussed getting the shingles shot with Maurine Ministerennis. Maurine MinisterDennis advised that since pt's daughter is pregnant and the baby isn't due until October he should wait to get the shingles shot until after the baby is born and a little older. Pt stated his insurance is requiring him to get the shot or have the form completed. Please advise. Thanks TNP

## 2015-12-16 NOTE — Telephone Encounter (Signed)
Please review. I placed the form back on your desk. Thanks!

## 2015-12-16 NOTE — Telephone Encounter (Signed)
Left message for patient to call back. We received his Medical Waiver form, and wanted to know if he is wanting to waiver the Shingles vaccine or for him to GET the shingles vaccine? Need more info. Thanks!

## 2015-12-17 NOTE — Telephone Encounter (Signed)
Done. Send to patient.

## 2015-12-18 NOTE — Telephone Encounter (Signed)
Advised patient as below. Faxed completed form to insurance company. File copy scanned into chart.

## 2015-12-30 ENCOUNTER — Other Ambulatory Visit: Payer: Self-pay | Admitting: Family Medicine

## 2015-12-30 ENCOUNTER — Telehealth: Payer: Self-pay | Admitting: Family Medicine

## 2015-12-30 DIAGNOSIS — M4316 Spondylolisthesis, lumbar region: Secondary | ICD-10-CM

## 2015-12-30 NOTE — Telephone Encounter (Signed)
Advise patient prescription for Tramadol ready for pick up.

## 2016-01-02 NOTE — Telephone Encounter (Signed)
Called Rx into pharmacy per patient request.

## 2016-01-16 ENCOUNTER — Other Ambulatory Visit: Payer: Self-pay | Admitting: Family Medicine

## 2016-01-16 NOTE — Telephone Encounter (Signed)
Is Nathaniel Baker pt. LOV with him was 11/11/2015. Allene DillonEmily Drozdowski, CMA

## 2016-01-16 NOTE — Telephone Encounter (Signed)
rx called in

## 2016-01-16 NOTE — Telephone Encounter (Signed)
Please call in tramadol.  

## 2016-01-27 ENCOUNTER — Encounter: Payer: Self-pay | Admitting: Family Medicine

## 2016-01-27 ENCOUNTER — Ambulatory Visit (INDEPENDENT_AMBULATORY_CARE_PROVIDER_SITE_OTHER): Payer: Managed Care, Other (non HMO) | Admitting: Family Medicine

## 2016-01-27 VITALS — BP 148/102 | HR 77 | Temp 98.3°F | Resp 16 | Wt 194.8 lb

## 2016-01-27 DIAGNOSIS — F0634 Mood disorder due to known physiological condition with mixed features: Secondary | ICD-10-CM

## 2016-01-27 DIAGNOSIS — Z23 Encounter for immunization: Secondary | ICD-10-CM

## 2016-01-27 DIAGNOSIS — M549 Dorsalgia, unspecified: Secondary | ICD-10-CM

## 2016-01-27 DIAGNOSIS — R030 Elevated blood-pressure reading, without diagnosis of hypertension: Secondary | ICD-10-CM | POA: Diagnosis not present

## 2016-01-27 DIAGNOSIS — G8929 Other chronic pain: Secondary | ICD-10-CM | POA: Diagnosis not present

## 2016-01-27 MED ORDER — DULOXETINE HCL 30 MG PO CPEP: 30.0000 mg | ORAL_CAPSULE | Freq: Every day | ORAL | 1 refills | Status: DC

## 2016-01-27 NOTE — Progress Notes (Signed)
Patient: Nathaniel Baker Male    DOB: 07/23/1955   60 y.o.   MRN: [id number] Visit Date: 01/27/2016  Today's Provider: [name], PA   Chief Complaint  Patient presents with  . Anxiety   Subjective:    Anxiety  Presents for initial visit. Onset was 1 to 6 months ago. The problem has been gradually worsening. Symptoms include decreased concentration, depressed mood, excessive worry, insomnia, irritability, nervous/anxious behavior, palpitations, panic and restlessness. Symptoms occur most days. The severity of symptoms is causing significant distress. The symptoms are aggravated by work stress. The quality of sleep is fair. Nighttime awakenings: one to two.   Past treatments include nothing.   Past Medical History:  Diagnosis Date  . Depression   . Hyperlipidemia   . Hypertension   . Thyroid disease    Past Surgical History:  Procedure Laterality Date  . PROSTATE SURGERY  2012   Biopsy x2  . TONSILLECTOMY  1965  . Tumor Removed Left 05/2014   Family History  Problem Relation Age of Onset  . Diabetes Sister   . Depression Sister   . Heart attack Mother   . Prostate cancer Brother   . Melanoma Brother   . Lung cancer Maternal Grandmother   . Diabetes Brother    Allergies  Allergen Reactions  . Yellow Jacket Venom      Previous Medications   CHOLECALCIFEROL (VITAMIN D) 1000 UNITS TABLET    Take by mouth.   FLUTICASONE (FLONASE) 50 MCG/ACT NASAL SPRAY    Place into the nose. Reported on 11/11/2015   KRILL OIL PO    Take by mouth.   MONTELUKAST (SINGULAIR) 10 MG TABLET    TAKE 1 TABLET EVERY DAY   MULTIPLE VITAMIN PO    Take by mouth.   TRAMADOL (ULTRAM) 50 MG TABLET    TAKE 1 TO 2 TABLETS BY MOUTH EVERY 8 HOURS AS NEEDED    Review of Systems  Constitutional: Positive for irritability.  Respiratory: Negative.   Cardiovascular: Positive for palpitations.  Psychiatric/Behavioral: Positive for agitation, decreased concentration and dysphoric mood. The patient is  nervous/anxious and has insomnia.     Social History  Substance Use Topics  . Smoking status: Never Smoker  . Smokeless tobacco: Not on file  . Alcohol use Yes   Objective:   Wt 194 lb 12.8 oz (88.4 kg)   BMI 29.62 kg/m   Physical Exam  Constitutional: He is oriented to person, place, and time. He appears well-developed and well-nourished. No distress.  HENT:  Head: Normocephalic and atraumatic.  Right Ear: Hearing normal.  Left Ear: Hearing normal.  Nose: Nose normal.  Eyes: Conjunctivae and lids are normal. Right eye exhibits no discharge. Left eye exhibits no discharge. No scleral icterus.  Pulmonary/Chest: Effort normal. No respiratory distress.  Musculoskeletal: Normal range of motion.  Neurological: He is alert and oriented to person, place, and time.  Skin: Skin is intact. No lesion and no rash noted.  Psychiatric: His speech is normal. Thought content normal. His mood appears anxious. He is agitated. He exhibits a depressed mood.      Assessment & Plan:   1. 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 Has chronic back pain due to W/C injury and now stressed by inability to do his usual job due to activity restrictions by company doctor. Injury occurred 10-11 months ago. Family and patient has noticed agitation and irritability with depressive  reactions. Wants to stop Tramadol and try Cymbalta for mood swings and chronic back pain. Recheck in 3-4 weeks. - DULoxetine (CYMBALTA) 30 MG capsule; Take 1 capsule (30 mg total) by mouth daily.  Dispense: 30 capsule; Refill: 1  2. Elevated blood-pressure reading without diagnosis of hypertension Elevated BP today suspected secondary to chronic pain and stress. Recommend limiting sodium and caffeine. Should continue back exercises and monitor BP at home. Recheck progress in 3-4 weeks.  3. Chronic back pain greater than 3 months duration Onset 10-11 months  ago from a back injury at work. Has been given permanent restrictions because of a vertebral fracture and spondylolisthesis. Has been using Tramadol prn up to 1-2 tablets daily and Advil prn. Wants to stop Tramadol and use the Cymbalta for chronic pain and mood disorder. - DULoxetine (CYMBALTA) 30 MG capsule; Take 1 capsule (30 mg total) by mouth daily.  Dispense: 30 capsule; Refill: 1  4. Need for influenza vaccination - Flu Vaccine QUAD 36+ mos PF IM (Fluarix & Fluzone Quad PF)

## 2016-02-23 ENCOUNTER — Other Ambulatory Visit: Payer: Self-pay | Admitting: Family Medicine

## 2016-02-23 NOTE — Telephone Encounter (Signed)
Refill request for Tramadol 50 mg 1 to 2 tablets q8hs prn Last filled by MD on-  Last Appt: 01/27/2016 Next Appt: none Please advise refill?

## 2016-02-23 NOTE — Telephone Encounter (Signed)
Need to recheck BP and need to know if he is still using the Cymbalta (which can cause serotonin syndrome if mixed with the Tramadol).

## 2016-02-25 NOTE — Telephone Encounter (Signed)
Patient stated that he is still taking the Cymbalta. Patient said he will not take the tramadol for now since Cymbalta is working well for him. Patient scheduled f/u appt.

## 2016-03-08 ENCOUNTER — Ambulatory Visit (INDEPENDENT_AMBULATORY_CARE_PROVIDER_SITE_OTHER): Payer: Managed Care, Other (non HMO) | Admitting: Family Medicine

## 2016-03-08 ENCOUNTER — Encounter: Payer: Self-pay | Admitting: Family Medicine

## 2016-03-08 VITALS — BP 142/88 | HR 74 | Temp 97.6°F | Resp 16 | Wt 191.6 lb

## 2016-03-08 DIAGNOSIS — G8929 Other chronic pain: Secondary | ICD-10-CM | POA: Diagnosis not present

## 2016-03-08 DIAGNOSIS — F0634 Mood disorder due to known physiological condition with mixed features: Secondary | ICD-10-CM | POA: Diagnosis not present

## 2016-03-08 DIAGNOSIS — M549 Dorsalgia, unspecified: Secondary | ICD-10-CM | POA: Diagnosis not present

## 2016-03-08 DIAGNOSIS — R030 Elevated blood-pressure reading, without diagnosis of hypertension: Secondary | ICD-10-CM

## 2016-03-08 MED ORDER — DULOXETINE HCL 30 MG PO CPEP: 30.0000 mg | ORAL_CAPSULE | Freq: Every day | ORAL | 4 refills | Status: DC

## 2016-03-08 NOTE — Progress Notes (Signed)
Patient: Nathaniel GainerRobert W Roswell Male    DOB: [birth year]   60 y.o.   MRN: [id number] Visit Date: 03/08/2016  Today's Provider: Dortha Kernennis Chrismon, PA   Chief Complaint  Patient presents with  . Hypertension  . Follow-up   Subjective:    HPI  Hypertension, follow-up:  BP Readings from Last 3 Encounters:  03/08/16 (!) 142/88  01/27/16 (!) 148/102  11/11/15 140/90    He was last seen for hypertension 2 months ago.  BP at that visit was 148/102. Management changes since that visit include limit sodium and caffeine intake. He reports excellent compliance with treatment. He is not having side effects.  He is exercising. He is adherent to low salt diet.   Outside blood pressures are not being checked. He is experiencing none.  Patient denies chest pain, irregular heart beat and palpitations.   Cardiovascular risk factors include advanced age (older than 6155 for men, 6161 for women), dyslipidemia, hypertension and male gender.  Use of agents associated with hypertension: none.     Weight trend: stable Wt Readings from Last 3 Encounters:  03/08/16 191 lb 9.6 oz (86.9 kg)  01/27/16 194 lb 12.8 oz (88.4 kg)  11/11/15 195 lb 3.2 oz (88.5 kg)    Current diet: well balanced  ------------------------------------------------------------------------ Past Medical History:  Diagnosis Date  . Depression   . Hyperlipidemia   . Hypertension   . Thyroid disease    Past Surgical History:  Procedure Laterality Date  . PROSTATE SURGERY  2012   Biopsy x2  . TONSILLECTOMY  1965  . Tumor Removed Left 05/2014   Family History  Problem Relation Age of Onset  . Diabetes Sister   . Depression Sister   . Heart attack Mother   . Prostate cancer Brother   . Melanoma Brother   . Lung cancer Maternal Grandmother   . Diabetes Brother    Allergies  Allergen Reactions  . Yellow Jacket Venom      Previous Medications   CHOLECALCIFEROL (VITAMIN D) 1000 UNITS TABLET    Take by mouth.   DULOXETINE  (CYMBALTA) 30 MG CAPSULE    Take 1 capsule (30 mg total) by mouth daily.   FLUTICASONE (FLONASE) 50 MCG/ACT NASAL SPRAY    Place into the nose. Reported on 11/11/2015   KRILL OIL PO    Take by mouth.   MONTELUKAST (SINGULAIR) 10 MG TABLET    TAKE 1 TABLET EVERY DAY   MULTIPLE VITAMIN PO    Take by mouth.    Review of Systems  Constitutional: Negative.   Respiratory: Negative.   Cardiovascular: Negative.     Social History  Substance Use Topics  . Smoking status: Never Smoker  . Smokeless tobacco: Not on file  . Alcohol use Yes   Objective:   BP (!) 142/88 (BP Location: Right Arm, Patient Position: Sitting, Cuff Size: Normal)   Pulse 74   Temp 97.6 F (36.4 C) (Oral)   Resp 16   Wt 191 lb 9.6 oz (86.9 kg)   BMI 29.13 kg/m   Physical Exam  Constitutional: He is oriented to person, place, and time. He appears well-developed and well-nourished. No distress.  HENT:  Head: Normocephalic and atraumatic.  Right Ear: Hearing normal.  Left Ear: Hearing normal.  Nose: Nose normal.  Eyes: Conjunctivae and lids are normal. Right eye exhibits no discharge. Left eye exhibits no discharge. No scleral icterus.  Cardiovascular: Normal rate and regular rhythm.   Pulmonary/Chest: Effort normal and breath sounds  normal. No respiratory distress.  Abdominal: Soft. Bowel sounds are normal.  Musculoskeletal:  Fair range of motion without radiation of pain today.  Neurological: He is alert and oriented to person, place, and time.  Skin: Skin is intact. No lesion and no rash noted.  Psychiatric: He has a normal mood and affect. His speech is normal and behavior is normal. Thought content normal.  Improved with minimal mood swings and anxiety now.      Assessment & Plan:     1. 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 States family and co-workers have noticed improvement, also. Will refill and  recheck in 3 months. - DULoxetine (CYMBALTA) 30 MG capsule; Take 1 capsule (30 mg total) by mouth daily.  Dispense: 30 capsule; Refill: 4  2. Chronic back pain greater than 3 months duration Back to work following W/C physician and surgeon's restrictions. No longer taking the Tramadol. Feels the Duloxetine is helping. Also, using Advil 200 mg up to 3-4 tablets TID with meals if needed. Follow up with W/C as planned. - DULoxetine (CYMBALTA) 30 MG capsule; Take 1 capsule (30 mg total) by mouth daily.  Dispense: 30 capsule; Refill: 4  3. Elevated blood pressure reading in office without diagnosis of hypertension Improved BP today. Continues to restrict sodium intake and caffeine. Recheck in 3 months.

## 2016-05-04 DIAGNOSIS — S46019A Strain of muscle(s) and tendon(s) of the rotator cuff of unspecified shoulder, initial encounter: Secondary | ICD-10-CM | POA: Insufficient documentation

## 2016-05-04 DIAGNOSIS — M25519 Pain in unspecified shoulder: Secondary | ICD-10-CM | POA: Insufficient documentation

## 2016-06-07 ENCOUNTER — Ambulatory Visit: Payer: Managed Care, Other (non HMO) | Admitting: Family Medicine

## 2016-06-11 ENCOUNTER — Ambulatory Visit: Payer: Managed Care, Other (non HMO) | Admitting: Family Medicine

## 2016-07-27 ENCOUNTER — Telehealth: Payer: Self-pay | Admitting: Family Medicine

## 2016-07-27 MED ORDER — PAROXETINE HCL 10 MG PO TABS: 10.0000 mg | ORAL_TABLET | Freq: Every day | ORAL | 3 refills | Status: DC

## 2016-07-27 NOTE — Telephone Encounter (Signed)
Patient agrees to try Paroxetine. [facility]

## 2016-07-27 NOTE — Telephone Encounter (Signed)
Pt called saying his rx for   DULoxetine (CYMBALTA) 30 MG capsule is now 160.00 vs the 11.00 he was paying last year.  He wants to know if there is something else he can take   [facility]  Thanks Barth Kirkseri

## 2016-07-27 NOTE — Telephone Encounter (Signed)
Recommend scheduling a follow up appointment to see if we can find an optional medication. Also, he could call his insurance company to ask them what optional medications are covered.

## 2016-07-27 NOTE — Telephone Encounter (Signed)
Maurine MinisterDennis discussed options with patient.

## 2016-07-27 NOTE — Telephone Encounter (Signed)
Paroxetine 10 mg qd #30 & 3 RF is an option on the [facility] $4 Drug List if he would like to try it.

## 2016-10-13 ENCOUNTER — Ambulatory Visit (INDEPENDENT_AMBULATORY_CARE_PROVIDER_SITE_OTHER): Payer: Self-pay | Admitting: Physician Assistant

## 2016-10-13 ENCOUNTER — Encounter: Payer: Self-pay | Admitting: Physician Assistant

## 2016-10-13 VITALS — BP 142/88 | HR 72 | Temp 98.4°F | Resp 16 | Wt 189.0 lb

## 2016-10-13 DIAGNOSIS — F32A Depression, unspecified: Secondary | ICD-10-CM

## 2016-10-13 DIAGNOSIS — F329 Major depressive disorder, single episode, unspecified: Secondary | ICD-10-CM

## 2016-10-13 DIAGNOSIS — M4316 Spondylolisthesis, lumbar region: Secondary | ICD-10-CM

## 2016-10-13 NOTE — Patient Instructions (Signed)
Back Pain, Adult  Back pain is very common. The pain often gets better over time. The cause of back pain is usually not dangerous. Most people can learn to manage their back pain on their own.  Follow these instructions at home:  Watch your back pain for any changes. The following actions may help to lessen any pain you are feeling:  · Stay active. Start with short walks on flat ground if you can. Try to walk farther each day.  · Exercise regularly as told by your doctor. Exercise helps your back heal faster. It also helps avoid future injury by keeping your muscles strong and flexible.  · Do not sit, drive, or stand in one place for more than 30 minutes.  · Do not stay in bed. Resting more than 1-2 days can slow down your recovery.  · Be careful when you bend or lift an object. Use good form when lifting:  ? Bend at your knees.  ? Keep the object close to your body.  ? Do not twist.  · Sleep on a firm mattress. Lie on your side, and bend your knees. If you lie on your back, put a pillow under your knees.  · Take medicines only as told by your doctor.  · Put ice on the injured area.  ? Put ice in a plastic bag.  ? Place a towel between your skin and the bag.  ? Leave the ice on for 20 minutes, 2-3 times a day for the first 2-3 days. After that, you can switch between ice and heat packs.  · Avoid feeling anxious or stressed. Find good ways to deal with stress, such as exercise.  · Maintain a healthy weight. Extra weight puts stress on your back.    Contact a doctor if:  · You have pain that does not go away with rest or medicine.  · You have worsening pain that goes down into your legs or buttocks.  · You have pain that does not get better in one week.  · You have pain at night.  · You lose weight.  · You have a fever or chills.  Get help right away if:  · You cannot control when you poop (bowel movement) or pee (urinate).  · Your arms or legs feel weak.  · Your arms or legs lose feeling (numbness).  · You feel sick  to your stomach (nauseous) or throw up (vomit).  · You have belly (abdominal) pain.  · You feel like you may pass out (faint).  This information is not intended to replace advice given to you by your health care provider. Make sure you discuss any questions you have with your health care provider.  Document Released: 11/03/2007 Document Revised: 10/23/2015 Document Reviewed: 09/18/2013  Elsevier Interactive Patient Education © 2017 Elsevier Inc.

## 2016-10-13 NOTE — Progress Notes (Signed)
Patient: Nathaniel GainerRobert W Baker Male    DOB: 1956/03/07   61 y.o.   MRN: [id number] Visit Date: 10/13/2016  Today's Provider: Trey SailorsAdriana M Ariyon Gerstenberger, PA-C   Chief Complaint  Patient presents with  . Back Pain    Chronic issue; lower back.   Subjective:    Back Pain  This is a chronic problem. The current episode started more than 1 year ago. The problem is unchanged. The pain is present in the sacro-iliac. The pain is at a severity of 5/10 (Pt reports last night was especially bad.  He says he was up most of the night.  Could not get comfortable. ). The symptoms are aggravated by position and bending. Stiffness is present all day. Associated symptoms include numbness and paresthesias. Pertinent negatives include no abdominal pain, bladder incontinence, bowel incontinence, headaches, leg pain, paresis, pelvic pain, tingling or weakness.   Nathaniel SignsRobert Baker is a 61 y/o male who injured his back at work two years ago with resultant L2-L5 spondylolisthesis on lumbar MRI (visible in Care Everywhere) presenting today with exacerbation of backpain. He has been taking 800 mg Ibuprofen TID with some relief, though he has flares. He has been evaluated by neurology in the past and says he was advised he would likely require spinal fusion in the future. He had been treated with tramadol in the past. He then went on Cymbalta for pain as well as mood and had to go off Tramadol. He lost insurance and then switched to Paxil. He returns today wanting to know if he can try Tramadol today or if it will have interactions with Paxil. Reports he is willing to come off Paxil as he feels his mood as improved as he is starting to have more interactions with family and enjoying life more.     Allergies  Allergen Reactions  . Yellow Jacket Venom      Current Outpatient Prescriptions:  .  cholecalciferol (VITAMIN D) 1000 units tablet, Take by mouth., Disp: , Rfl:  .  MULTIPLE VITAMIN PO, Take by mouth., Disp: , Rfl:  .   PARoxetine (PAXIL) 10 MG tablet, Take 1 tablet (10 mg total) by mouth daily., Disp: 30 tablet, Rfl: 3 .  DULoxetine (CYMBALTA) 30 MG capsule, Take 1 capsule (30 mg total) by mouth daily. (Patient not taking: Reported on 10/13/2016), Disp: 30 capsule, Rfl: 4 .  fluticasone (FLONASE) 50 MCG/ACT nasal spray, Place into the nose. Reported on 11/11/2015, Disp: , Rfl:  .  montelukast (SINGULAIR) 10 MG tablet, TAKE 1 TABLET EVERY DAY (Patient not taking: Reported on 03/08/2016), Disp: 30 tablet, Rfl: 1  Review of Systems  Constitutional: Negative.   Gastrointestinal: Negative for abdominal pain and bowel incontinence.  Genitourinary: Negative.  Negative for bladder incontinence and pelvic pain.  Musculoskeletal: Positive for back pain and gait problem. Negative for arthralgias, joint swelling, myalgias, neck pain and neck stiffness.  Neurological: Positive for numbness and paresthesias. Negative for dizziness, tingling, weakness, light-headedness and headaches.    Social History  Substance Use Topics  . Smoking status: Never Smoker  . Smokeless tobacco: Never Used  . Alcohol use Yes   Objective:   BP (!) 142/88 (BP Location: Left Arm, Patient Position: Sitting, Cuff Size: Normal)   Pulse 72   Temp 98.4 F (36.9 C) (Oral)   Resp 16   Wt 189 lb (85.7 kg)   BMI 28.74 kg/m  Vitals:   10/13/16 1122  BP: (!) 142/88  Pulse: 72  Resp: 16  Temp: 98.4 F (36.9 C)  TempSrc: Oral  Weight: 189 lb (85.7 kg)     Physical Exam      Assessment & Plan:     1. Spondylolisthesis of lumbar region  [place] over our options today. Talked about other interventions such as steroid taper, Gabapentin, and tramadol. We talked about how since his pain is neuropathic, gabapentin would likely have the most effect. However, I called [facility], and patient cannot afford Gabapentin. Unfortunately, we would run into the same issue with Paxil and Tramadol as he did with Cymbalta. Prices listed below:   6 day taper 10  mg Prednisone - 33.74 30 daysTramadol 50 mg TID: 16.14$$$ 30 days Gabapentin 300 mg TID: 77$$  Because of his financial situation, he says he would be willing to come off of Paxil to try PRN tramadol for breakthrough pain. Let him know that I would talk about this with Nathaniel Baker as well and we'd be in contact shortly.  2. Depression, unspecified depression type  Willing to taper Paxil as he reports improvement in mood and overall situation in life now that workman's comp file has been closed.  Return if symptoms worsen or fail to improve.  The entirety of the information documented in the History of Present Illness, Review of Systems and Physical Exam were personally obtained by me. Portions of this information were initially documented by Kavin Leech, CMA and reviewed by me for thoroughness and accuracy.         Trey Sailors, PA-C  [facility]

## 2016-10-15 ENCOUNTER — Other Ambulatory Visit: Payer: Self-pay | Admitting: Family Medicine

## 2016-10-15 MED ORDER — TRAMADOL HCL 50 MG PO TABS: 50.0000 mg | ORAL_TABLET | Freq: Three times a day (TID) | ORAL | 0 refills | Status: DC | PRN

## 2016-10-15 NOTE — Progress Notes (Signed)
Having back pain from radiculopathy and trying to postpone surgery. Unable to afford Cymbalta for chronic pain because of insurance not covering medications. Requests Tramadol because it helped in the past.

## 2016-12-13 ENCOUNTER — Other Ambulatory Visit: Payer: Self-pay | Admitting: Family Medicine

## 2016-12-14 ENCOUNTER — Other Ambulatory Visit: Payer: Self-pay | Admitting: Family Medicine

## 2016-12-14 DIAGNOSIS — M4316 Spondylolisthesis, lumbar region: Secondary | ICD-10-CM

## 2016-12-14 MED ORDER — TRAMADOL HCL 50 MG PO TABS: 50.0000 mg | ORAL_TABLET | Freq: Three times a day (TID) | ORAL | 0 refills | Status: DC | PRN

## 2016-12-14 NOTE — Telephone Encounter (Signed)
RX called in at [facility] pharmacy  

## 2016-12-14 NOTE — Telephone Encounter (Signed)
Phone in refill of Tramadol as approved in chart.

## 2016-12-16 ENCOUNTER — Encounter: Payer: Self-pay | Admitting: Family Medicine

## 2016-12-16 ENCOUNTER — Ambulatory Visit (INDEPENDENT_AMBULATORY_CARE_PROVIDER_SITE_OTHER): Payer: Self-pay | Admitting: Family Medicine

## 2016-12-16 VITALS — BP 142/98 | HR 85 | Temp 97.9°F | Wt 193.6 lb

## 2016-12-16 DIAGNOSIS — M4316 Spondylolisthesis, lumbar region: Secondary | ICD-10-CM

## 2016-12-16 DIAGNOSIS — G8929 Other chronic pain: Secondary | ICD-10-CM

## 2016-12-16 DIAGNOSIS — M5441 Lumbago with sciatica, right side: Secondary | ICD-10-CM

## 2016-12-16 NOTE — Progress Notes (Signed)
Patient: Nathaniel Baker Male    DOB: February 18, 1956   61 y.o.   MRN: [id number] Visit Date: 12/16/2016  Today's Provider: Dortha Kernennis Chrismon, PA   Chief Complaint  Patient presents with  . Back Pain   Subjective:    Back Pain  This is a chronic problem. The current episode started more than 1 year ago. The problem occurs constantly. The problem is unchanged. The pain is present in the lumbar spine. The quality of the pain is described as aching. Associated symptoms include numbness. Treatments tried: Tramadol.  Patient is here today to request a written note stating he has back pain. He is looking to get a in-ground pool installed and researched online that with a provider's note he will be able to get a tax deductible. Patient states [facility] and does water aerobics it helps with back pain. He is also requesting a handicap sticker form be completed.   Patient Active Problem List   Diagnosis Date Noted  . Strain of rotator cuff capsule 05/04/2016  . Shoulder pain 05/04/2016  . Spondylolisthesis of lumbar region 06/24/2015  . Elevated prostate specific antigen (PSA) 06/12/2015  . Insect sting 06/12/2015  . Allergic rhinitis 06/12/2015  . 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 Elevated blood-pressure reading without diagnosis of hypertension 06/12/2015  . Tachycardia 06/12/2015  . History of throat cancer 06/12/2015  . Hyperthyroidism 06/12/2015  . Adenopathy 06/12/2015  . 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 Avitaminosis D 06/12/2015  . 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 Clinical depression 03/27/2009  . Hypercholesterolemia without hypertriglyceridemia 03/27/2009   Past Surgical History:  Procedure Laterality Date  . PROSTATE SURGERY  2012   Biopsy x2  . TONSILLECTOMY  1965  . Tumor Removed Left 05/2014   Family History  Problem Relation Age of Onset  . Diabetes Sister   . Depression Sister   . Heart  attack Mother   . Prostate cancer Brother   . Melanoma Brother   . Lung cancer Maternal Grandmother   . Diabetes Brother    Past Medical History:  Diagnosis Date  . Depression   . Hyperlipidemia   . Hypertension   . Thyroid disease    Allergies  Allergen Reactions  . Yellow Jacket Venom      Previous Medications   CHOLECALCIFEROL (VITAMIN D) 1000 UNITS TABLET    Take by mouth.   FLUTICASONE (FLONASE) 50 MCG/ACT NASAL SPRAY    Place into the nose. Reported on 11/11/2015   MONTELUKAST (SINGULAIR) 10 MG TABLET    TAKE 1 TABLET EVERY DAY   MULTIPLE VITAMIN PO    Take by mouth.   PAROXETINE (PAXIL) 10 MG TABLET    Take 1 tablet (10 mg total) by mouth daily.   TRAMADOL (ULTRAM) 50 MG TABLET    Take 1 tablet (50 mg total) by mouth every 8 (eight) hours as needed.    Review of Systems  Constitutional: Negative.   Respiratory: Negative.   Cardiovascular: Negative.   Musculoskeletal: Positive for back pain and gait problem.  Neurological: Positive for numbness.    Social History  Substance Use Topics  . Smoking status: Never Smoker  . Smokeless tobacco: Never Used  . Alcohol use Yes   Objective:   BP (!) 142/98 (BP Location: Right Arm, Patient Position: Sitting, Cuff Size: Normal)   Pulse 85   Temp 97.9 F (36.6 C) (Oral)   Wt  193 lb 9.6 oz (87.8 kg)   SpO2 97%   BMI 29.44 kg/m   Physical Exam  Constitutional: He is oriented to person, place, and time. He appears well-developed and well-nourished. No distress.  HENT:  Head: Normocephalic and atraumatic.  Right Ear: Hearing normal.  Left Ear: Hearing normal.  Nose: Nose normal.  Eyes: Conjunctivae and lids are normal. Right eye exhibits no discharge. Left eye exhibits no discharge. No scleral icterus.  Neck: Neck supple.  Cardiovascular: Normal rate and regular rhythm.   Pulmonary/Chest: Effort normal and breath sounds normal. No respiratory distress.  Abdominal: Soft. Bowel sounds are normal.  Musculoskeletal:    Very tender over right and left lower back with radiation to the right posterolateral thigh. Unable to flex spine without intense pain. Walking with cane today. SLR's to 80 degrees increases pain on the right.   Neurological: He is alert and oriented to person, place, and time.  DTR's - minimal response with chronic pain.  Skin: Skin is intact. No lesion and no rash noted.  Psychiatric: He has a normal mood and affect. His speech is normal and behavior is normal. Thought content normal.      Assessment & Plan:     1. Chronic right-sided low back pain with right-sided sciatica 2. Spondylolisthesis of lumbar region Chronic back pain secondary to injury at work 2 years ago with L2-L5 spondylolisthesis on MRI. Pain at a 4-5/10 level today. Has some right leg radiculitis and numbness. Unable to work now and is under evaluation by Dr. Yetta Barre (neurologist) for disability determination. Presently not employable due to sharp right lower back pain with movement. Using Tramadol for uncontrolled pain but unable to afford other medications due to loss of insurance and inability to work. Finds swimming or water exercise has helped to ease discomfort/pain and wants to see if he can get assistance to build a pool at his home. Letter written to document his condition. Recheck prn.

## 2017-02-17 ENCOUNTER — Other Ambulatory Visit: Payer: Self-pay | Admitting: Family Medicine

## 2017-02-18 NOTE — Telephone Encounter (Signed)
RX called in at [facility] pharmacy  

## 2017-02-18 NOTE — Telephone Encounter (Signed)
Phone in refill of Tramadol as in chart.

## 2017-02-26 IMAGING — CR DG LUMBAR SPINE COMPLETE 4+V
1 series · 5 of 5 positions shown · non-contrast
Comparison: None.

CLINICAL DATA: Acute onset of right radicular back pain after heavy
lifting.

EXAM:
LUMBAR SPINE - COMPLETE 4+ VIEW

[Series 1: dg lumbar spine complete 4 +v · 0.14mm/px · 5 of 5 slices shown]
[im 1/5]
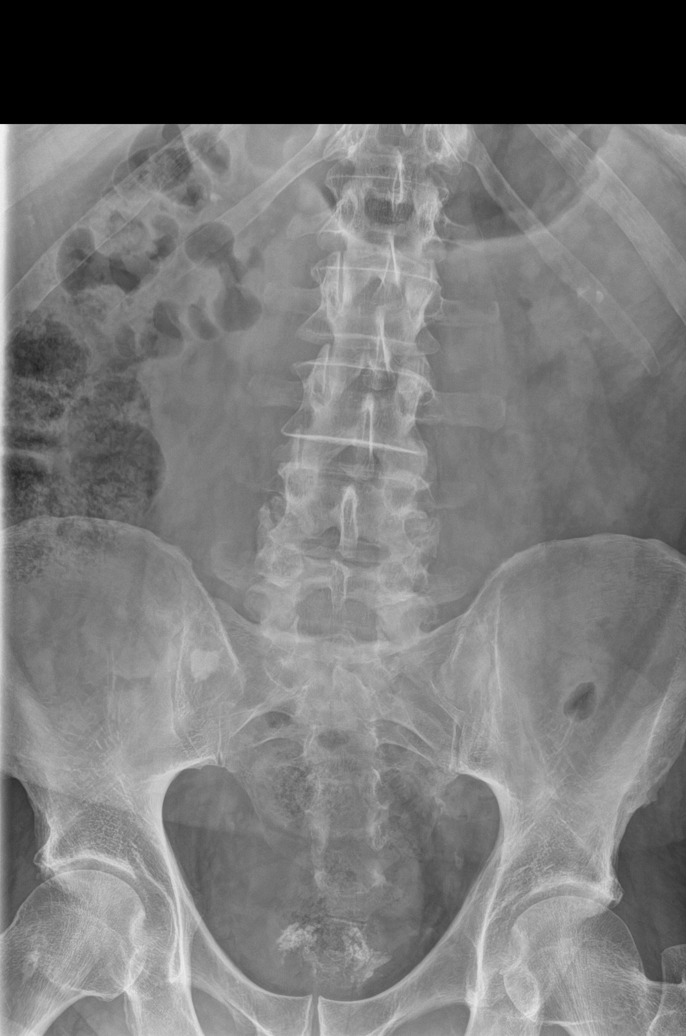
[im 2/5]
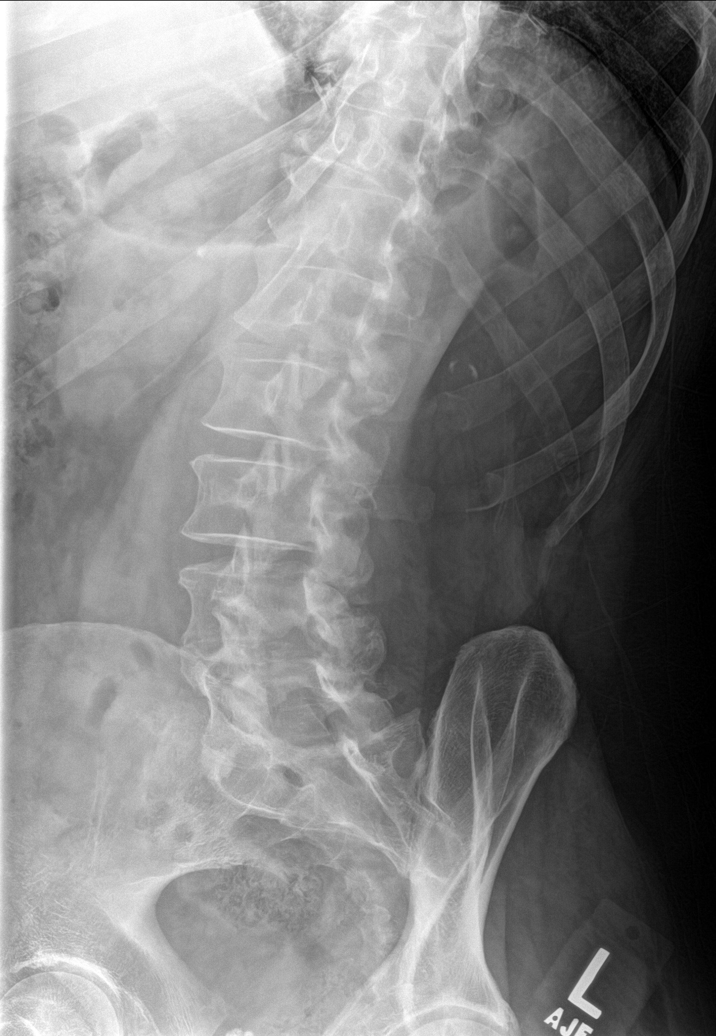
[im 3/5]
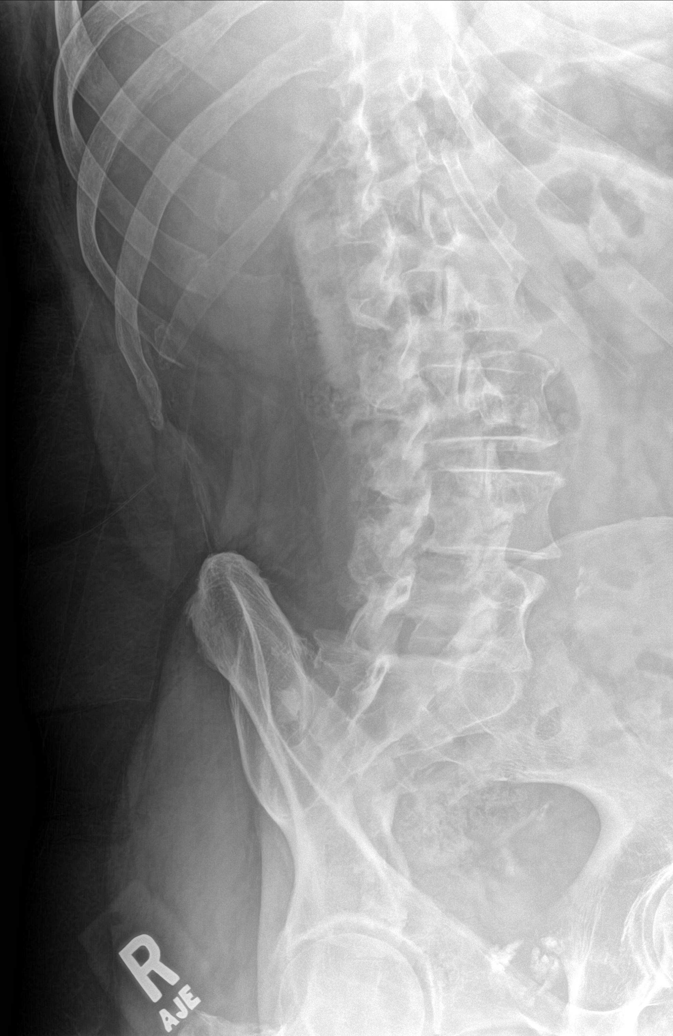
[im 4/5]
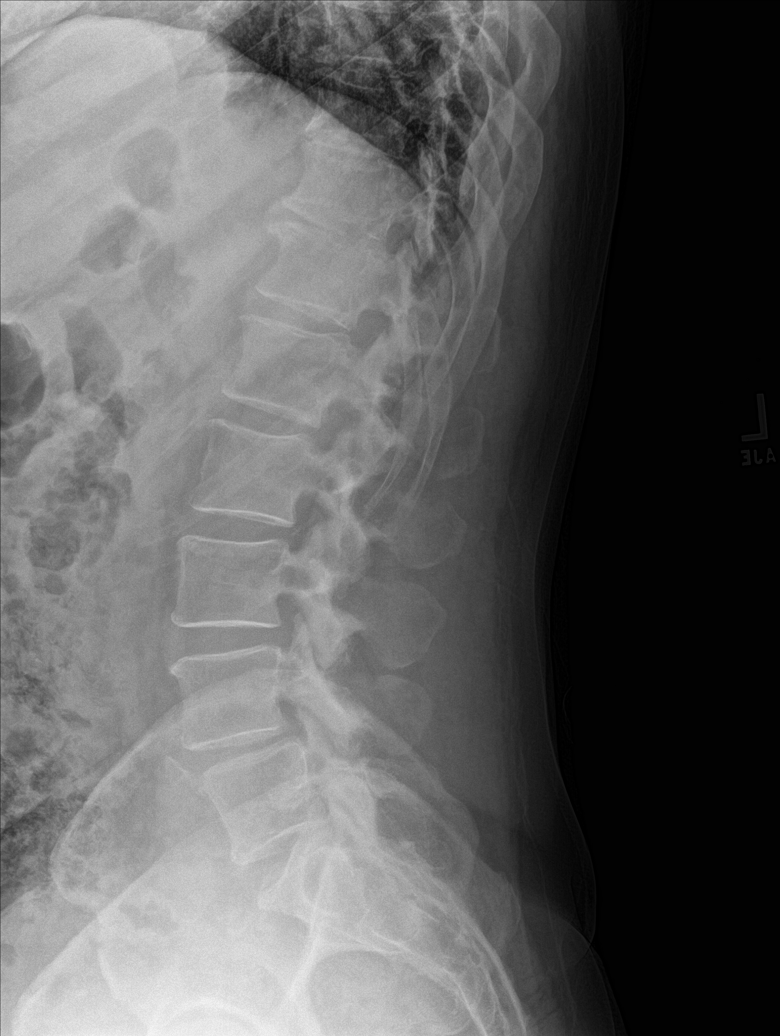
[im 5/5]
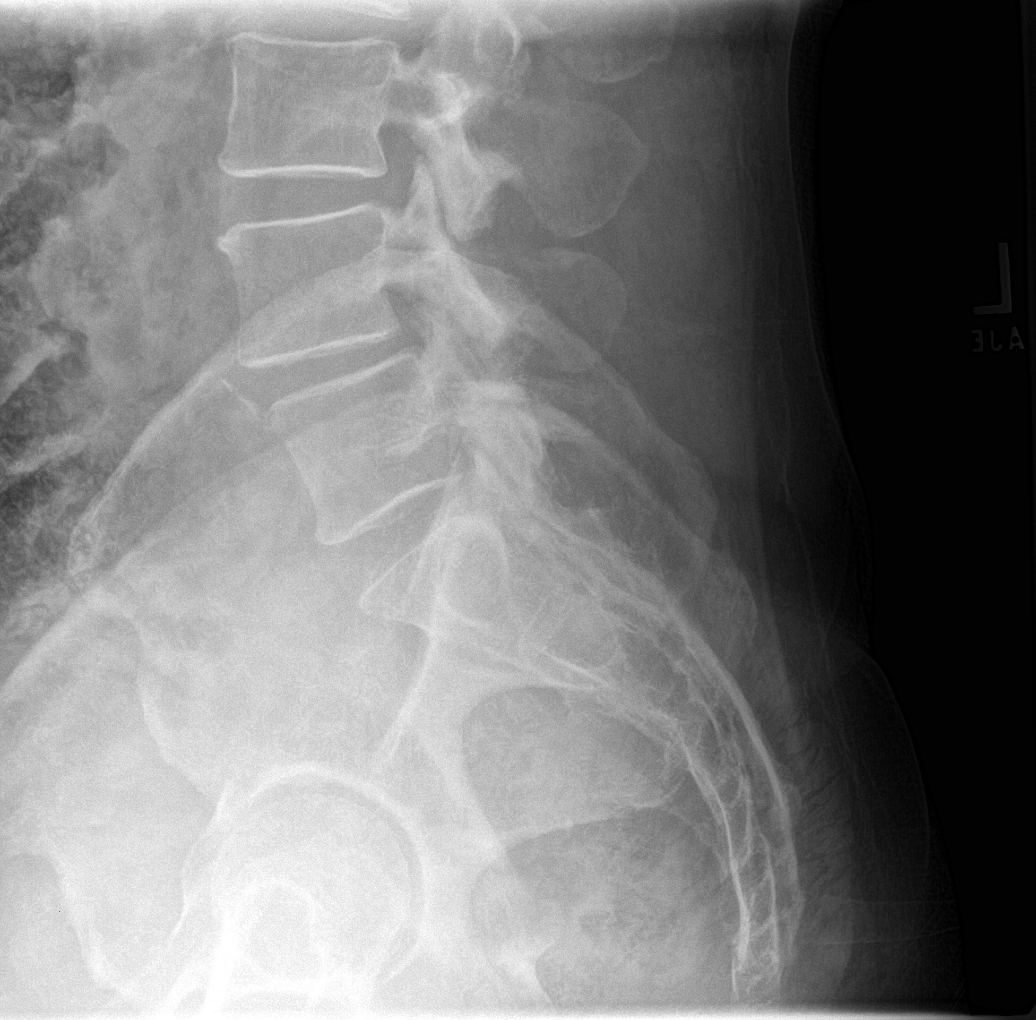

[5 of 5 positions shown; findings below may reference images not displayed]

FINDINGS: L4-5 and L5-S1 facet arthropathy with mild anterolisthesis at L4-5.
L4-5 moderate disc narrowing. No acute fracture, endplate erosion,
or aggressive bone lesion.

Sclerotic focus in the right sacroiliac joint is likely a bone
island.

Extensive prostate calcification.

Probable bilateral nephrolithiasis.
IMPRESSION: 1. No acute finding.
2. Lower lumbar facet arthropathy with L4-5 disc narrowing and
anterolisthesis.
3. Probable bilateral nephrolithiasis.

## 2017-03-22 ENCOUNTER — Ambulatory Visit (INDEPENDENT_AMBULATORY_CARE_PROVIDER_SITE_OTHER): Payer: Self-pay | Admitting: Family Medicine

## 2017-03-22 ENCOUNTER — Encounter: Payer: Self-pay | Admitting: Family Medicine

## 2017-03-22 VITALS — BP 138/90 | HR 75 | Temp 98.2°F | Wt 192.2 lb

## 2017-03-22 DIAGNOSIS — M4316 Spondylolisthesis, lumbar region: Secondary | ICD-10-CM

## 2017-03-22 DIAGNOSIS — G8929 Other chronic pain: Secondary | ICD-10-CM

## 2017-03-22 DIAGNOSIS — M5441 Lumbago with sciatica, right side: Secondary | ICD-10-CM

## 2017-03-22 DIAGNOSIS — Z23 Encounter for immunization: Secondary | ICD-10-CM

## 2017-03-22 NOTE — Progress Notes (Signed)
Patient: Nathaniel GainerRobert W Sandora Male    DOB: [birth year]   61 y.o.   MRN: [id number] Visit Date: 03/22/2017   Today's Provider: Dortha Kernennis Mcguire Gasparyan, PA   Chief Complaint  Patient presents with  . Back Pain   Subjective:    Back Pain  This is a chronic problem. The problem occurs constantly. The problem is unchanged. The pain is present in the lumbar spine. The quality of the pain is described as aching. Treatments tried: Tramadol.  Chronic back pain secondary to injury at work 2 years ago with L2-L5 spondylolisthesis on MRI. Has had disability determination by an neurologist (Dr. Yetta BarreJones) through the worker's compensation insurance who suggested he exercise to strengthen his core to prevent need for major surgery (rod fusions). Has episodes of very sharp pain with certain movements or walking and numbness in the right leg. When he exercises or swims in his neighbor's pool, he does not have to take the Tramadol at night (maximum usage is 2-3 tablets a day). Walks with a cane due to chronic back pain. Has tried narcotics, Cymbalta, muscle relaxants and NSAID's with little relief. Also, does not want to use any "addictive" medications. Patient is requesting a letter of recommendation for a bed. He states if he receives a letter insurance will pay and the fees may be tax deductible. Patient has already purchased bed and finds he sleeps better with less pain.   Past Medical History:  Diagnosis Date  . Depression   . Hyperlipidemia   . Hypertension   . Thyroid disease    Past Surgical History:  Procedure Laterality Date  . PROSTATE SURGERY  2012   Biopsy x2  . TONSILLECTOMY  1965  . Tumor Removed Left 05/2014   Family History  Problem Relation Age of Onset  . Diabetes Sister   . Depression Sister   . Heart attack Mother   . Prostate cancer Brother   . Melanoma Brother   . Lung cancer Maternal Grandmother   . Diabetes Brother    Allergies  Allergen Reactions  . Yellow Jacket Venom        Previous Medications   PAROXETINE (PAXIL) 10 MG TABLET    Take 1 tablet (10 mg total) by mouth daily.   TRAMADOL (ULTRAM) 50 MG TABLET    TAKE 1 TABLET BY MOUTH EVERY 8 HOURS AS NEEDED   UNABLE TO FIND    Med Name: Beverlee NimsDoretta    Review of Systems  Constitutional: Negative.   Respiratory: Negative.   Cardiovascular: Negative.   Musculoskeletal: Positive for back pain.    Social History  Substance Use Topics  . Smoking status: Never Smoker  . Smokeless tobacco: Never Used  . Alcohol use Yes   Objective:   BP 138/90 (BP Location: Right Arm, Patient Position: Sitting, Cuff Size: Normal)   Pulse 75   Temp 98.2 F (36.8 C) (Oral)   Wt 192 lb 3.2 oz (87.2 kg)   SpO2 98%   BMI 29.22 kg/m   Physical Exam  Constitutional: He is oriented to person, place, and time. He appears well-developed and well-nourished. No distress.  HENT:  Head: Normocephalic and atraumatic.  Right Ear: Hearing normal.  Left Ear: Hearing normal.  Nose: Nose normal.  Eyes: Conjunctivae and lids are normal. Right eye exhibits no discharge. Left eye exhibits no discharge. No scleral icterus.  Neck: Neck supple.  Cardiovascular: Normal rate and regular rhythm.   Pulmonary/Chest: Effort normal. No respiratory distress.  Abdominal: Soft. Bowel sounds are  normal.  Musculoskeletal:  Limited flexion of lumbar spine due to pain that radiated down the posterior right thigh to the anterior right knee. Good ROM in knees, hips and ankles. Normal muscle strength.  Neurological: He is alert and oriented to person, place, and time.  DTR's of knees 1+ bilaterally.  Skin: Skin is intact. No lesion and no rash noted.  Psychiatric: He has a normal mood and affect. His speech is normal and behavior is normal. Thought content normal.      Assessment & Plan:     1. Chronic right-sided low back pain with right-sided sciatic 2. Spondylolisthesis of lumbar region Chronic back pain and unable to be employed since worker's comp  injury 2 years ago. Essentially uninsured now. Finds water exercise/swimming helps control pain and requires less analgesics. Will be building a pool next year. Sleeping better and wake with less pain since using a motorized bed to adjust positions. Letter written to help with tax deductions for durable medical equipment. Continue Tramadol as needed and use of cane for support.  3. Need for influenza vaccination - Flu Vaccine QUAD 6+ mos PF IM (Fluarix Quad PF)

## 2017-03-29 ENCOUNTER — Encounter: Payer: Self-pay | Admitting: Family Medicine

## 2017-03-29 ENCOUNTER — Telehealth: Payer: Self-pay

## 2017-03-29 NOTE — Telephone Encounter (Signed)
Patient called to check the status of the letter of recommendation for the bed that he requested at last OV on 03/22/2017. Please advise.

## 2017-03-29 NOTE — Telephone Encounter (Signed)
Letter is ready for pick-up.

## 2017-03-30 NOTE — Telephone Encounter (Signed)
Patient advised.

## 2017-05-03 ENCOUNTER — Other Ambulatory Visit: Payer: Self-pay | Admitting: Family Medicine

## 2017-05-03 MED ORDER — TRAMADOL HCL 50 MG PO TABS: 50.0000 mg | ORAL_TABLET | Freq: Three times a day (TID) | ORAL | 0 refills | Status: DC | PRN

## 2017-05-03 NOTE — Telephone Encounter (Signed)
RX called in at [facility] pharmacy  

## 2017-05-03 NOTE — Telephone Encounter (Signed)
Phone in refill of the Tramadol as listed in chart.

## 2017-05-03 NOTE — Telephone Encounter (Signed)
[facility] pharmacy faxed a refill request on the following medication. Thanks [facility]  traMADol (ULTRAM) 50 MG tablet

## 2017-08-02 ENCOUNTER — Other Ambulatory Visit: Payer: Self-pay | Admitting: Family Medicine

## 2017-09-06 ENCOUNTER — Encounter: Payer: Self-pay | Admitting: Family Medicine

## 2017-09-06 ENCOUNTER — Ambulatory Visit (INDEPENDENT_AMBULATORY_CARE_PROVIDER_SITE_OTHER): Payer: Self-pay | Admitting: Family Medicine

## 2017-09-06 VITALS — BP 148/88 | HR 75 | Temp 98.3°F | Ht 68.0 in | Wt 195.6 lb

## 2017-09-06 DIAGNOSIS — G8929 Other chronic pain: Secondary | ICD-10-CM

## 2017-09-06 DIAGNOSIS — M5441 Lumbago with sciatica, right side: Secondary | ICD-10-CM

## 2017-09-06 DIAGNOSIS — F32A Depression, unspecified: Secondary | ICD-10-CM

## 2017-09-06 DIAGNOSIS — F329 Major depressive disorder, single episode, unspecified: Secondary | ICD-10-CM

## 2017-09-06 NOTE — Progress Notes (Signed)
Patient: Nathaniel Baker Male    DOB: 23-Jan-1956   62 y.o.   MRN: [id number] Visit Date: 09/06/2017  Today's Provider: [name], PA   Chief Complaint  Patient presents with  . Depression  . Follow-up   Subjective:    HPI Depression Follow Up:  Patient presents for a 9 month follow up. Last OV was on 10/13/16. Patient reported improvement in mood and overall situation in life at that OV. Patient advised to taper Paxil. Patient states he stopped taking Tramadol and restarted Paxil 10 mg. Patient reports symptoms are stable at this time.     Past Medical History:  Diagnosis Date  . Depression   . Hyperlipidemia   . Hypertension   . Thyroid disease    Patient Active Problem List   Diagnosis Date Noted  . Strain of rotator cuff capsule 05/04/2016  . Shoulder pain 05/04/2016  . Spondylolisthesis of lumbar region 06/24/2015  . Elevated prostate specific antigen (PSA) 06/12/2015  . Insect sting 06/12/2015  . Allergic rhinitis 06/12/2015  . 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 Elevated blood-pressure reading without diagnosis of hypertension 06/12/2015  . Tachycardia 06/12/2015  . History of throat cancer 06/12/2015  . Hyperthyroidism 06/12/2015  . Adenopathy 06/12/2015  . 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 Avitaminosis D 06/12/2015  . 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 Clinical depression 03/27/2009  . Hypercholesterolemia without hypertriglyceridemia 03/27/2009   Past Surgical History:  Procedure Laterality Date  . PROSTATE SURGERY  2012   Biopsy x2  . TONSILLECTOMY  1965  . Tumor Removed Left 05/2014   Family History  Problem Relation Age of Onset  . Diabetes Sister   . Depression Sister   . Heart attack Mother   . Prostate cancer Brother   . Melanoma Brother   . Lung cancer Maternal Grandmother   . Diabetes Brother    Allergies  Allergen Reactions  . Yellow Jacket Venom     Current  Outpatient Medications:  .  PARoxetine (PAXIL) 10 MG tablet, TAKE 1 TABLET BY MOUTH ONCE DAILY, Disp: 30 tablet, Rfl: 3 .  UNABLE TO FIND, Med Name: Doretta, Disp: , Rfl:  .  UNABLE TO FIND, Med Name: CBD Oil, Disp: , Rfl:  .  traMADol (ULTRAM) 50 MG tablet, Take 1 tablet (50 mg total) by mouth every 8 (eight) hours as needed. (Patient not taking: Reported on 09/06/2017), Disp: 90 tablet, Rfl: 0  Review of Systems  Constitutional: Negative.   Respiratory: Negative.   Cardiovascular: Negative.   Psychiatric/Behavioral: Positive for dysphoric mood.   Social History   Tobacco Use  . Smoking status: Never Smoker  . Smokeless tobacco: Never Used  Substance Use Topics  . Alcohol use: Yes   Objective:   BP (!) 148/88 (BP Location: Right Arm, Patient Position: Sitting, Cuff Size: Normal)   Pulse 75   Temp 98.3 F (36.8 C) (Oral)   Ht 5\' 8"  (1.727 m)   Wt 195 lb 9.6 oz (88.7 kg)   SpO2 98%   BMI 29.74 kg/m   Physical Exam  Constitutional: He is oriented to person, place, and time. He appears well-developed and well-nourished. No distress.  HENT:  Head: Normocephalic and atraumatic.  Right Ear: Hearing normal.  Left Ear: Hearing normal.  Nose: Nose normal.  Eyes: Conjunctivae and lids are normal. Right eye exhibits no discharge. Left eye exhibits no discharge. No scleral icterus.  Cardiovascular: Normal  rate.  Pulmonary/Chest: Effort normal. No respiratory distress.  Musculoskeletal:  Chronic low back pain on the right with radiation down the right posterior leg to the knee. Right leg weaker than the left. DTR's symmetric knee jerk.  Neurological: He is alert and oriented to person, place, and time.  Skin: Skin is intact. No lesion and no rash noted.  Psychiatric: He has a normal mood and affect. His speech is normal and behavior is normal. Thought content normal.   Assessment & Plan:     1. Depression, unspecified depression type Wife had noticed more moodiness and sadness over  the past couple months since he stopped the Paxil while using the Tramadol. Stopped the Tramadol and went back on the Paxil 10 mg qd. Feeling more stable and states wife had noticed improvement, also. States he cannot afford any follow up labs. May counsel with his pastor and continue the Paxil. Recheck in 4-6 months.  2. Chronic right-sided low back pain with right-sided sciatica Feels chronic back pain stable on Aleve and CBD oil . No longer taking Tramadol. May apply moist heat and recheck progress in 4-6 months. Follow up sooner if needed.       Dortha Kernennis Verlan Grotz, PA  [facility]

## 2017-09-09 ENCOUNTER — Ambulatory Visit: Payer: Self-pay | Admitting: Family Medicine

## 2017-12-05 ENCOUNTER — Other Ambulatory Visit: Payer: Self-pay | Admitting: Family Medicine

## 2018-03-27 ENCOUNTER — Telehealth: Payer: Self-pay | Admitting: Family Medicine

## 2018-03-27 ENCOUNTER — Other Ambulatory Visit: Payer: Self-pay | Admitting: Family Medicine

## 2018-03-27 DIAGNOSIS — F32A Depression, unspecified: Secondary | ICD-10-CM

## 2018-03-27 DIAGNOSIS — F329 Major depressive disorder, single episode, unspecified: Secondary | ICD-10-CM

## 2018-03-27 MED ORDER — PAROXETINE HCL 10 MG PO TABS: 10.0000 mg | ORAL_TABLET | Freq: Every day | ORAL | 1 refills | Status: DC

## 2018-03-27 NOTE — Telephone Encounter (Signed)
Pt needing a 90 day supply on:  PARoxetine (PAXIL) 10 MG tablet  Please fill at new location due to lower cost:  [facility] Address: [employer], [address] [place], [place], [place]  Phone: [phone]   [facility], [facility]

## 2018-09-28 ENCOUNTER — Other Ambulatory Visit: Payer: Self-pay | Admitting: Family Medicine

## 2018-09-28 DIAGNOSIS — F329 Major depressive disorder, single episode, unspecified: Secondary | ICD-10-CM

## 2018-09-28 DIAGNOSIS — F32A Depression, unspecified: Secondary | ICD-10-CM

## 2018-12-28 ENCOUNTER — Other Ambulatory Visit: Payer: Self-pay | Admitting: Family Medicine

## 2018-12-28 DIAGNOSIS — F329 Major depressive disorder, single episode, unspecified: Secondary | ICD-10-CM

## 2018-12-28 DIAGNOSIS — F32A Depression, unspecified: Secondary | ICD-10-CM

## 2018-12-28 MED ORDER — PAROXETINE HCL 10 MG PO TABS: 10.0000 mg | ORAL_TABLET | Freq: Every day | ORAL | 0 refills | Status: DC

## 2019-01-29 ENCOUNTER — Other Ambulatory Visit: Payer: Self-pay | Admitting: Family Medicine

## 2019-01-29 DIAGNOSIS — F329 Major depressive disorder, single episode, unspecified: Secondary | ICD-10-CM

## 2019-01-29 DIAGNOSIS — F32A Depression, unspecified: Secondary | ICD-10-CM

## 2019-03-02 ENCOUNTER — Other Ambulatory Visit: Payer: Self-pay | Admitting: Family Medicine

## 2019-03-02 DIAGNOSIS — F329 Major depressive disorder, single episode, unspecified: Secondary | ICD-10-CM

## 2019-03-02 DIAGNOSIS — F32A Depression, unspecified: Secondary | ICD-10-CM

## 2019-03-06 ENCOUNTER — Encounter: Payer: Self-pay | Admitting: Family Medicine

## 2019-03-06 ENCOUNTER — Other Ambulatory Visit: Payer: Self-pay

## 2019-03-06 ENCOUNTER — Ambulatory Visit (INDEPENDENT_AMBULATORY_CARE_PROVIDER_SITE_OTHER): Payer: Medicare HMO | Admitting: Family Medicine

## 2019-03-06 VITALS — BP 150/80 | HR 87 | Temp 96.9°F | Wt 194.0 lb

## 2019-03-06 DIAGNOSIS — R030 Elevated blood-pressure reading, without diagnosis of hypertension: Secondary | ICD-10-CM

## 2019-03-06 DIAGNOSIS — R69 Illness, unspecified: Secondary | ICD-10-CM | POA: Diagnosis not present

## 2019-03-06 DIAGNOSIS — E7800 Pure hypercholesterolemia, unspecified: Secondary | ICD-10-CM | POA: Diagnosis not present

## 2019-03-06 DIAGNOSIS — F329 Major depressive disorder, single episode, unspecified: Secondary | ICD-10-CM | POA: Diagnosis not present

## 2019-03-06 DIAGNOSIS — F32A Depression, unspecified: Secondary | ICD-10-CM

## 2019-03-06 MED ORDER — PAROXETINE HCL 10 MG PO TABS: ORAL_TABLET | ORAL | 3 refills | Status: DC

## 2019-03-06 NOTE — Progress Notes (Signed)
Nathaniel Baker  MRN: [id number] DOB: 07-20-1955  Subjective:  HPI   The patient is a 63 year old male who presents today for follow up of depression.   He was last seen on 09/06/17.  In August he was instructed that he had to be seen in order for [place] to continue his medication. Patient states he is doing well on the Paxil and feels he does not need any adjusting of the medicine.  He further states that he is no longer using Tramadol for pain and is doing well with his back.  Patient Active Problem List   Diagnosis Date Noted  . Strain of rotator cuff capsule 05/04/2016  . Shoulder pain 05/04/2016  . Spondylolisthesis of lumbar region 06/24/2015  . Elevated prostate specific antigen (PSA) 06/12/2015  . Insect sting 06/12/2015  . Allergic rhinitis 06/12/2015  . 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 Elevated blood-pressure reading without diagnosis of hypertension 06/12/2015  . Tachycardia 06/12/2015  . History of throat cancer 06/12/2015  . Hyperthyroidism 06/12/2015  . Adenopathy 06/12/2015  . 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 Avitaminosis D 06/12/2015  . 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 Clinical depression 03/27/2009  . Hypercholesterolemia without hypertriglyceridemia 03/27/2009   Past Medical History:  Diagnosis Date  . Depression   . Hyperlipidemia   . Hypertension   . Thyroid disease    Past Surgical History:  Procedure Laterality Date  . PROSTATE SURGERY  2012   Biopsy x2  . TONSILLECTOMY  1965  . Tumor Removed Left 05/2014   Social History   Socioeconomic History  . Marital status: Married    Spouse name: Not on file  . Number of children: Not on file  . Years of education: Not on file  . Highest education level: Not on file  Occupational History  . Not on file  Social Needs  . Financial resource strain: Not on file  . Food insecurity    Worry: Not on file    Inability: Not on file  .  Transportation needs    Medical: Not on file    Non-medical: Not on file  Tobacco Use  . Smoking status: Never Smoker  . Smokeless tobacco: Never Used  Substance and Sexual Activity  . Alcohol use: Yes  . Drug use: No  . Sexual activity: Not on file  Lifestyle  . Physical activity    Days per week: Not on file    Minutes per session: Not on file  . Stress: Not on file  Relationships  . Social Herbalist on phone: Not on file    Gets together: Not on file    Attends religious service: Not on file    Active member of club or organization: Not on file    Attends meetings of clubs or organizations: Not on file    Relationship status: Not on file  . Intimate partner violence    Fear of current or ex partner: Not on file    Emotionally abused: Not on file    Physically abused: Not on file    Forced sexual activity: Not on file  Other Topics Concern  . Not on file  Social History Narrative  . Not on file   Outpatient Encounter Medications as of 03/06/2019  Medication Sig  . PARoxetine (PAXIL) 10 MG tablet Take 1 tablet by mouth daily. MUST SCHEDULE OFFICE OR VIRTUAL VISIT WITH LABS IN  THE NEXT 30 DAYS TO CONTINUE THIS MEDICATION.  . traMADol (ULTRAM) 50 MG tablet Take 1 tablet (50 mg total) by mouth every 8 (eight) hours as needed. (Patient not taking: Reported on 09/06/2017)  . [DISCONTINUED] UNABLE TO FIND Med Name: Beverlee NimsDoretta  . [DISCONTINUED] UNABLE TO FIND Med Name: CBD Oil   No facility-administered encounter medications on file as of 03/06/2019.    Allergies  Allergen Reactions  . Yellow Jacket Venom    Review of Systems  Constitutional: Negative for chills, diaphoresis, fever and malaise/fatigue.  HENT: Negative for congestion, ear pain and sore throat.   Respiratory: Negative for cough and shortness of breath.   Cardiovascular: Negative for chest pain.  Gastrointestinal: Negative for abdominal pain and diarrhea.  Neurological: Negative for headaches.   Psychiatric/Behavioral: Positive for depression. Negative for hallucinations and suicidal ideas. The patient does not have insomnia.     Objective:  BP (!) 150/80 (BP Location: Right Arm, Patient Position: Sitting, Cuff Size: Normal)   Pulse 87   Temp (!) 96.9 F (36.1 C) (Skin)   Wt 194 lb (88 kg)   SpO2 99%   BMI 29.50 kg/m   Wt Readings from Last 3 Encounters:  03/06/19 194 lb (88 kg)  09/06/17 195 lb 9.6 oz (88.7 kg)  03/22/17 192 lb 3.2 oz (87.2 kg)   BP Readings from Last 3 Encounters:  03/06/19 (!) 150/80  09/06/17 (!) 148/88  03/22/17 138/90   Physical Exam  Constitutional: He is oriented to person, place, and time and well-developed, well-nourished, and in no distress.  HENT:  Head: Normocephalic.  Eyes: Conjunctivae and EOM are normal.  Neck: Neck supple. No JVD present.  Cardiovascular: Normal rate and regular rhythm.  Pulmonary/Chest: Effort normal and breath sounds normal.  Abdominal: Soft. Bowel sounds are normal. He exhibits no mass.  Musculoskeletal: Normal range of motion.  Lymphadenopathy:    He has no cervical adenopathy.  Neurological: He is alert and oriented to person, place, and time.  DTR's 2+ upper extremities and left knee - <1+ on the right. SLR's 80-90 degrees with some tightness in the right lower back. No weakness and fair range of motion.  Skin: No rash noted.  Psychiatric: Mood, affect and judgment normal.   [facility] 03/06/2019 09/06/2017 03/22/2017  Decreased Interest 0 0 0  Down, Depressed, Hopeless - 1 0  PHQ - 2 Score 0 1 0  Altered sleeping 0 0 0  Tired, decreased energy 0 1 1  Change in appetite 0 0 0  Feeling bad or failure about yourself  0 2 1  Trouble concentrating 0 1 1  Moving slowly or fidgety/restless 1 0 0  Suicidal thoughts 0 0 0  PHQ-9 Score 1 5 3   Difficult doing work/chores Not difficult at all Not difficult at all Not difficult at all     Assessment and Plan :  1. Depression, unspecified  depression type Feeling better now that back pain is improved. Mood is stable and reacts appropriately. Sleeping well with normal appetite. No suicidal ideation. Completing tasks with satisfaction of accomplishment. Paroxetine continues to control anxiety and sad mood. Refilled medication and recheck CBC, TSH and CMP. - CBC with Differential/Platelet - Comprehensive metabolic panel - TSH - PARoxetine (PAXIL) 10 MG tablet; Take 1 tablet by mouth daily.  Dispense: 90 tablet; Refill: 3  2. Elevated blood-pressure reading without diagnosis of hypertension Borderline BP. No chest pains, dyspnea, edema, dizziness or palpitations. Recheck routine labs and recommend he monitor  BP at home. Should restrict caffeine, salt intake and ETOH. Should exercise by walking 30-40 minutes 3-4 days a week. - CBC with Differential/Platelet - Comprehensive metabolic panel - TSH  3. Hypercholesterolemia without hypertriglyceridemia Trying to control fats in diet. Was exercising by swimming during the summer months. Will recheck CMP, TSH and Lipid Panel. - Comprehensive metabolic panel - Lipid panel - TSH

## 2019-03-16 DIAGNOSIS — R69 Illness, unspecified: Secondary | ICD-10-CM | POA: Diagnosis not present

## 2019-03-16 DIAGNOSIS — R030 Elevated blood-pressure reading, without diagnosis of hypertension: Secondary | ICD-10-CM | POA: Diagnosis not present

## 2019-03-16 DIAGNOSIS — E7800 Pure hypercholesterolemia, unspecified: Secondary | ICD-10-CM | POA: Diagnosis not present

## 2019-03-17 LAB — CBC WITH DIFFERENTIAL/PLATELET
Basophils Absolute: 0.1 10*3/uL (ref 0.0–0.2)
Basos: 1 %
EOS (ABSOLUTE): 0.3 10*3/uL (ref 0.0–0.4)
Eos: 4 %
Hematocrit: 43 % (ref 37.5–51.0)
Hemoglobin: 14.2 g/dL (ref 13.0–17.7)
Immature Grans (Abs): 0 10*3/uL (ref 0.0–0.1)
Immature Granulocytes: 0 %
Lymphocytes Absolute: 3.4 10*3/uL — ABNORMAL HIGH (ref 0.7–3.1)
Lymphs: 41 %
MCH: 27 pg (ref 26.6–33.0)
MCHC: 33 g/dL (ref 31.5–35.7)
MCV: 82 fL (ref 79–97)
Monocytes Absolute: 0.6 10*3/uL (ref 0.1–0.9)
Monocytes: 8 %
Neutrophils Absolute: 3.9 10*3/uL (ref 1.4–7.0)
Neutrophils: 46 %
Platelets: 291 10*3/uL (ref 150–450)
RBC: 5.25 x10E6/uL (ref 4.14–5.80)
RDW: 13.4 % (ref 11.6–15.4)
WBC: 8.3 10*3/uL (ref 3.4–10.8)

## 2019-03-17 LAB — COMPREHENSIVE METABOLIC PANEL
ALT: 24 IU/L (ref 0–44)
AST: 23 IU/L (ref 0–40)
Albumin/Globulin Ratio: 1.6 (ref 1.2–2.2)
Albumin: 4.3 g/dL (ref 3.8–4.8)
Alkaline Phosphatase: 58 IU/L (ref 39–117)
BUN/Creatinine Ratio: 13 (ref 10–24)
BUN: 14 mg/dL (ref 8–27)
Bilirubin Total: 0.7 mg/dL (ref 0.0–1.2)
CO2: 21 mmol/L (ref 20–29)
Calcium: 9.2 mg/dL (ref 8.6–10.2)
Chloride: 107 mmol/L — ABNORMAL HIGH (ref 96–106)
Creatinine, Ser: 1.07 mg/dL (ref 0.76–1.27)
GFR calc Af Amer: 84 mL/min/{1.73_m2} (ref >59)
GFR calc non Af Amer: 73 mL/min/{1.73_m2} (ref >59)
Globulin, Total: 2.7 g/dL (ref 1.5–4.5)
Glucose: 101 mg/dL — ABNORMAL HIGH (ref 65–99)
Potassium: 4.2 mmol/L (ref 3.5–5.2)
Sodium: 141 mmol/L (ref 134–144)
Total Protein: 7 g/dL (ref 6.0–8.5)

## 2019-03-17 LAB — LIPID PANEL
Chol/HDL Ratio: 5.1 ratio — ABNORMAL HIGH (ref 0.0–5.0)
Cholesterol, Total: 244 mg/dL — ABNORMAL HIGH (ref 100–199)
HDL: 48 mg/dL (ref >39)
LDL Chol Calc (NIH): 183 mg/dL — ABNORMAL HIGH (ref 0–99)
Triglycerides: 76 mg/dL (ref 0–149)
VLDL Cholesterol Cal: 13 mg/dL (ref 5–40)

## 2019-03-17 LAB — TSH: TSH: 1.6 u[IU]/mL (ref 0.450–4.500)

## 2019-05-17 DIAGNOSIS — R69 Illness, unspecified: Secondary | ICD-10-CM | POA: Diagnosis not present

## 2019-06-06 DIAGNOSIS — R69 Illness, unspecified: Secondary | ICD-10-CM | POA: Diagnosis not present

## 2019-08-13 DIAGNOSIS — R69 Illness, unspecified: Secondary | ICD-10-CM | POA: Diagnosis not present

## 2019-08-17 ENCOUNTER — Other Ambulatory Visit: Payer: Self-pay

## 2019-08-17 ENCOUNTER — Emergency Department
Admission: EM | Admit: 2019-08-17 | Discharge: 2019-08-17 | Disposition: A | Discharge status: Other Acute Inpatient Hospital | Payer: Medicare HMO | Attending: Emergency Medicine | Admitting: Emergency Medicine

## 2019-08-17 ENCOUNTER — Emergency Department: Payer: Medicare HMO

## 2019-08-17 DIAGNOSIS — W298XXA Contact with other powered powered hand tools and household machinery, initial encounter: Secondary | ICD-10-CM | POA: Diagnosis not present

## 2019-08-17 DIAGNOSIS — S61012A Laceration without foreign body of left thumb without damage to nail, initial encounter: Secondary | ICD-10-CM | POA: Insufficient documentation

## 2019-08-17 DIAGNOSIS — S6992XA Unspecified injury of left wrist, hand and finger(s), initial encounter: Secondary | ICD-10-CM | POA: Diagnosis present

## 2019-08-17 DIAGNOSIS — R279 Unspecified lack of coordination: Secondary | ICD-10-CM | POA: Diagnosis not present

## 2019-08-17 DIAGNOSIS — Z79899 Other long term (current) drug therapy: Secondary | ICD-10-CM | POA: Insufficient documentation

## 2019-08-17 DIAGNOSIS — Y999 Unspecified external cause status: Secondary | ICD-10-CM | POA: Insufficient documentation

## 2019-08-17 DIAGNOSIS — Z23 Encounter for immunization: Secondary | ICD-10-CM | POA: Diagnosis not present

## 2019-08-17 DIAGNOSIS — R0902 Hypoxemia: Secondary | ICD-10-CM | POA: Diagnosis not present

## 2019-08-17 DIAGNOSIS — Y929 Unspecified place or not applicable: Secondary | ICD-10-CM | POA: Insufficient documentation

## 2019-08-17 DIAGNOSIS — Y939 Activity, unspecified: Secondary | ICD-10-CM | POA: Diagnosis not present

## 2019-08-17 DIAGNOSIS — S62512A Displaced fracture of proximal phalanx of left thumb, initial encounter for closed fracture: Secondary | ICD-10-CM | POA: Diagnosis not present

## 2019-08-17 DIAGNOSIS — W312XXA Contact with powered woodworking and forming machines, initial encounter: Secondary | ICD-10-CM | POA: Insufficient documentation

## 2019-08-17 DIAGNOSIS — S68012A Complete traumatic metacarpophalangeal amputation of left thumb, initial encounter: Secondary | ICD-10-CM | POA: Diagnosis not present

## 2019-08-17 DIAGNOSIS — Z743 Need for continuous supervision: Secondary | ICD-10-CM | POA: Diagnosis not present

## 2019-08-17 DIAGNOSIS — R000 Tachycardia, unspecified: Secondary | ICD-10-CM | POA: Diagnosis not present

## 2019-08-17 DIAGNOSIS — R52 Pain, unspecified: Secondary | ICD-10-CM | POA: Diagnosis not present

## 2019-08-17 DIAGNOSIS — I10 Essential (primary) hypertension: Secondary | ICD-10-CM | POA: Diagnosis not present

## 2019-08-17 DIAGNOSIS — S68022A Partial traumatic metacarpophalangeal amputation of left thumb, initial encounter: Secondary | ICD-10-CM | POA: Diagnosis not present

## 2019-08-17 LAB — CBC WITH DIFFERENTIAL/PLATELET
Abs Immature Granulocytes: 0.04 10*3/uL (ref 0.00–0.07)
Basophils Absolute: 0.1 10*3/uL (ref 0.0–0.1)
Basophils Relative: 1 %
Eosinophils Absolute: 0.2 10*3/uL (ref 0.0–0.5)
Eosinophils Relative: 1 %
HCT: 41.5 % (ref 39.0–52.0)
Hemoglobin: 13.7 g/dL (ref 13.0–17.0)
Immature Granulocytes: 0 %
Lymphocytes Relative: 19 %
Lymphs Abs: 2.5 10*3/uL (ref 0.7–4.0)
MCH: 27.1 pg (ref 26.0–34.0)
MCHC: 33 g/dL (ref 30.0–36.0)
MCV: 82 fL (ref 80.0–100.0)
Monocytes Absolute: 0.8 10*3/uL (ref 0.1–1.0)
Monocytes Relative: 6 %
Neutro Abs: 10.1 10*3/uL — ABNORMAL HIGH (ref 1.7–7.7)
Neutrophils Relative %: 73 %
Platelets: 244 10*3/uL (ref 150–400)
RBC: 5.06 MIL/uL (ref 4.22–5.81)
RDW: 13.7 % (ref 11.5–15.5)
WBC: 13.7 10*3/uL — ABNORMAL HIGH (ref 4.0–10.5)
nRBC: 0 % (ref 0.0–0.2)

## 2019-08-17 LAB — BASIC METABOLIC PANEL
Anion gap: 10 (ref 5–15)
BUN: 14 mg/dL (ref 8–23)
CO2: 22 mmol/L (ref 22–32)
Calcium: 8.9 mg/dL (ref 8.9–10.3)
Chloride: 106 mmol/L (ref 98–111)
Creatinine, Ser: 1.07 mg/dL (ref 0.61–1.24)
GFR calc Af Amer: >60 mL/min (ref >60)
GFR calc non Af Amer: >60 mL/min (ref >60)
Glucose, Bld: 133 mg/dL — ABNORMAL HIGH (ref 70–99)
Potassium: 3.7 mmol/L (ref 3.5–5.1)
Sodium: 138 mmol/L (ref 135–145)

## 2019-08-17 MED ORDER — ONDANSETRON HCL 4 MG/2ML IJ SOLN
4.0000 mg | Freq: Once | INTRAMUSCULAR | Status: AC
  Administered 2019-08-17: 19:00:00 4 mg via INTRAVENOUS
  Filled 2019-08-17: qty 2

## 2019-08-17 MED ORDER — SODIUM CHLORIDE 0.9 % IV SOLN
2.0000 g | Freq: Once | INTRAVENOUS | Status: AC
  Administered 2019-08-17: 2 g via INTRAVENOUS
  Filled 2019-08-17: qty 20

## 2019-08-17 MED ORDER — HYDROMORPHONE HCL 1 MG/ML IJ SOLN
1.0000 mg | Freq: Once | INTRAMUSCULAR | Status: AC
  Administered 2019-08-17: 20:00:00 1 mg via INTRAVENOUS
  Filled 2019-08-17: qty 1

## 2019-08-17 MED ORDER — FENTANYL CITRATE (PF) 100 MCG/2ML IJ SOLN
100.0000 ug | Freq: Once | INTRAMUSCULAR | Status: AC
  Administered 2019-08-17: 19:00:00 100 ug via INTRAVENOUS
  Filled 2019-08-17: qty 2

## 2019-08-17 NOTE — ED Triage Notes (Signed)
Pt cut the 1st digit of the left hand with a table saw. Thumb is macerated on the palmar side. Nail bed intact. Pt pale and diaphoretic. Pt had syncopal episode during triage.

## 2019-08-17 NOTE — ED Provider Notes (Signed)
[facility] Emergency Department Provider Note ____________________________________________   First MD Initiated Contact with Patient 08/17/19 1906     (approximate)  I have reviewed the triage vital signs and the nursing notes.   HISTORY  Chief Complaint Laceration    HPI Nathaniel Baker is a 64 y.o. male with PMH as noted below who presents with left thumb injury, acute onset just prior to coming to the hospital.  The patient was using a table saw and sliced through the thumb.  He denies any other injuries.  He is right-handed.  Past Medical History:  Diagnosis Date  . Depression   . Hyperlipidemia   . Hypertension   . Thyroid disease     Patient Active Problem List   Diagnosis Date Noted  . Strain of rotator cuff capsule 05/04/2016  . Shoulder pain 05/04/2016  . Spondylolisthesis of lumbar region 06/24/2015  . Elevated prostate specific antigen (PSA) 06/12/2015  . Insect sting 06/12/2015  . Allergic rhinitis 06/12/2015  . 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 Elevated blood-pressure reading without diagnosis of hypertension 06/12/2015  . Tachycardia 06/12/2015  . History of throat cancer 06/12/2015  . Hyperthyroidism 06/12/2015  . Adenopathy 06/12/2015  . 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 Avitaminosis D 06/12/2015  . 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 Clinical depression 03/27/2009  . Hypercholesterolemia without hypertriglyceridemia 03/27/2009    Past Surgical History:  Procedure Laterality Date  . PROSTATE SURGERY  2012   Biopsy x2  . TONSILLECTOMY  1965  . Tumor Removed Left 05/2014    Prior to Admission medications   Medication Sig Start Date End Date Taking? Authorizing Provider  PARoxetine (PAXIL) 10 MG tablet Take 1 tablet by mouth daily. 03/06/19   Chrismon, Jodell Cipro, PA    Allergies Yellow jacket venom  Family History  Problem Relation Age of Onset  .  Diabetes Sister   . Depression Sister   . Heart attack Mother   . Prostate cancer Brother   . Melanoma Brother   . Lung cancer Maternal Grandmother   . Diabetes Brother     Social History Social History   Tobacco Use  . Smoking status: Never Smoker  . Smokeless tobacco: Never Used  Substance Use Topics  . Alcohol use: Yes  . Drug use: No    Review of Systems  Constitutional: No fever. Eyes: No redness. ENT: No sore throat. Cardiovascular: Denies chest pain. Respiratory: Denies shortness of breath. Gastrointestinal: No vomiting. Genitourinary: Negative for flank pain.  Musculoskeletal: Negative for back pain.  Positive for left thumb injury. Skin: Negative for rash. Neurological: Negative for headache.   ____________________________________________   PHYSICAL EXAM:  VITAL SIGNS: ED Triage Vitals  Enc Vitals Group     BP      Pulse      Resp      Temp      Temp src      SpO2      Weight      Height      Head Circumference      Peak Flow      Pain Score      Pain Loc      Pain Edu?      Excl. in GC?     Constitutional: Alert and oriented.  Uncomfortable and anxious appearing but in no acute distress. Eyes: Conjunctivae are normal.  Head: Atraumatic. Nose: No congestion/rhinnorhea. Mouth/Throat: Mucous membranes  are moist.   Neck: Normal range of motion.  Cardiovascular: Tachycardic, regular rhythm. Good peripheral circulation. Respiratory: Normal respiratory effort.  No retractions.  Gastrointestinal: No distention.  Musculoskeletal: Extremities warm and well perfused.  Left thumb laceration laterally across the thumb.  It extends from the volar surface at the pad at an angle through the IP joint and almost entirely through to the dorsal aspect of the proximal thumb. Neurologic:   No gross focal neurologic deficits are appreciated.  Skin:  Skin is warm and dry. No rash noted. Psychiatric: Mood and affect are normal. Speech and behavior are normal.   ____________________________________________   LABS (all labs ordered are listed, but only abnormal results are displayed)  Labs Reviewed  BASIC METABOLIC PANEL - Abnormal; Notable for the following components:      Result Value   Glucose, Bld 133 (*)    All other components within normal limits  CBC WITH DIFFERENTIAL/PLATELET - Abnormal; Notable for the following components:   WBC 13.7 (*)    Neutro Abs 10.1 (*)    All other components within normal limits   ____________________________________________  EKG  ED ECG REPORT I, Dionne Bucy, the attending physician, personally viewed and interpreted this ECG.  Date: 08/17/2019 EKG Time: 1902 Rate: 105 Rhythm: Sinus tachycardia QRS Axis: normal Intervals: RBBB ST/T Wave abnormalities: normal Narrative Interpretation: no evidence of acute ischemia  ____________________________________________  RADIOLOGY  XR L thumb: Proximal phalanx fracture  ____________________________________________   PROCEDURES  Procedure(s) performed: No  Procedures  Critical Care performed: Yes  CRITICAL CARE Performed by: Dionne Bucy   Total critical care time: 30 minutes  Critical care time was exclusive of separately billable procedures and treating other patients.  Critical care was necessary to treat or prevent imminent or life-threatening deterioration.  Critical care was time spent personally by me on the following activities: development of treatment plan with patient and/or surrogate as well as nursing, discussions with consultants, evaluation of patient's response to treatment, examination of patient, obtaining history from patient or surrogate, ordering and performing treatments and interventions, ordering and review of laboratory studies, ordering and review of radiographic studies, pulse oximetry and re-evaluation of patient's condition. ____________________________________________   INITIAL IMPRESSION /  ASSESSMENT AND PLAN / ED COURSE  Pertinent labs & imaging results that were available during my care of the patient were reviewed by me and considered in my medical decision making (see chart for details).  64 year old male with PMH as noted above presents with left thumb injury with a table saw.  The patient has sliced across the thumb from the pad, through the IP joint, through the proximal phalanx with some dorsal proximal soft tissue holding it on.  He is unable to move the thumb.  He has no other injuries.  Initially on arrival, the patient appeared very anxious and near syncopal.  He is tachycardic due to acute pain and anxiety, but his other vital signs are normal.  The physical exam is otherwise unremarkable.  I was unable to immediately unwrap the thumb due to the patient's severe pain.  He has gotten fentanyl and was able to be evaluated.  We will obtain an x-ray and consult orthopedics.  Overall, since the patient has no significant arterial bleeding I suspect that he may have some intact circulation to the distal part of the thumb and it could potentially be salvaged.  ----------------------------------------- 10:20 PM on 08/17/2019 -----------------------------------------  X-ray confirms a fracture through the proximal phalanx.  I immediately consulted Dr.  Nappo from orthopedics and discussed the clinical and imaging findings.  He recommended transfer to a facility with hand surgery.  I then contacted the transfer center at [facility] and was put in touch with the on-call hand specialist.  He advised that if there was any possibility of salvage of the digit, he would recommend transfer to [facility] or other tertiary center with advanced hand care.  I then contacted the [facility] transfer center.  The patient was accepted by Dr. Dorna Bloom.  The patient remains clinically stable.  He is pending transportation at this time. ____________________________________________   FINAL CLINICAL  IMPRESSION(S) / ED DIAGNOSES  Final diagnoses:  Laceration of left thumb without foreign body without damage to nail, initial encounter      NEW MEDICATIONS STARTED DURING THIS VISIT:  New Prescriptions   No medications on file     Note:  This document was prepared using Dragon voice recognition software and may include unintentional dictation errors.    Arta Silence, MD 08/17/19 2222

## 2019-08-17 NOTE — ED Notes (Signed)
Pt given ice chips at this time with approval from Dr. Marisa Severin.

## 2019-08-20 ENCOUNTER — Ambulatory Visit: Payer: Self-pay | Admitting: Family Medicine

## 2019-08-20 DIAGNOSIS — S68012S Complete traumatic metacarpophalangeal amputation of left thumb, sequela: Secondary | ICD-10-CM | POA: Insufficient documentation

## 2019-08-20 DIAGNOSIS — Z792 Long term (current) use of antibiotics: Secondary | ICD-10-CM | POA: Diagnosis not present

## 2019-08-20 DIAGNOSIS — W312XXS Contact with powered woodworking and forming machines, sequela: Secondary | ICD-10-CM | POA: Diagnosis not present

## 2019-08-20 DIAGNOSIS — Z79891 Long term (current) use of opiate analgesic: Secondary | ICD-10-CM | POA: Diagnosis not present

## 2019-08-20 DIAGNOSIS — S68012D Complete traumatic metacarpophalangeal amputation of left thumb, subsequent encounter: Secondary | ICD-10-CM | POA: Diagnosis not present

## 2019-08-20 DIAGNOSIS — X58XXXD Exposure to other specified factors, subsequent encounter: Secondary | ICD-10-CM | POA: Diagnosis not present

## 2019-08-20 NOTE — Telephone Encounter (Signed)
  Pt reports had left thumb injury(table saw) this weekend and had thumb surgically amputated. Calling today to question dressing change. Pt states he has discharge instructions "And a lot of stuff: hydrogen peroxide, swabs, guaze." Pt reports small amt of dried blood on bandage. States he also has a splint and ace wrap. States discharge instructions read as though he should change dressing in 7-10 days but pt questioning if he should change today.    Advised to call surgeon. Pt states he will do so . Assured NT would route to practice for PCPs review. States he has appt. Friday.   Reason for Disposition . [1] Caller requesting NON-URGENT health information AND [2] PCP's office is the best resource  Answer Assessment - Initial Assessment Questions 1. REASON FOR CALL or QUESTION: "What is your reason for calling today?" or "How can I best help you?" or "What question do you have that I can help answer?"     Dressing change  Protocols used: INFORMATION ONLY CALL-A-AH

## 2019-08-23 NOTE — Telephone Encounter (Signed)
Agree he should contact the orthopedic surgeon, but, if the bandage is dry and not wet with blood, it should be OK to leave it as is.

## 2019-08-24 ENCOUNTER — Ambulatory Visit (INDEPENDENT_AMBULATORY_CARE_PROVIDER_SITE_OTHER): Payer: Medicare HMO | Admitting: Family Medicine

## 2019-08-24 ENCOUNTER — Encounter: Payer: Self-pay | Admitting: Family Medicine

## 2019-08-24 ENCOUNTER — Other Ambulatory Visit: Payer: Self-pay

## 2019-08-24 VITALS — BP 160/104 | HR 88 | Temp 97.5°F | Resp 16 | Ht 68.0 in | Wt 204.0 lb

## 2019-08-24 DIAGNOSIS — S68012D Complete traumatic metacarpophalangeal amputation of left thumb, subsequent encounter: Secondary | ICD-10-CM | POA: Diagnosis not present

## 2019-08-24 NOTE — Progress Notes (Signed)
Patient: Nathaniel Baker Male    DOB: May 03, 1956   64 y.o.   MRN: [id number] Visit Date: 08/24/2019  Today's Provider: Vernie Murders, PA   Chief Complaint  Patient presents with  . Hospitalization Follow-up  . Dressing Change   Subjective:     HPI    Follow up ER visit  Patient was seen in ER for Laceration of left thumb  on 08/17/2019. He was treated for laceration of left thumb. Treatment for this included; amputation of left thumb. He reports good compliance with treatment. He reports this condition is Improved.  -----------------------------------------------------------  Patient is here for bandage removal, from left thumb amputation.   Past Medical History:  Diagnosis Date  . Depression   . Hyperlipidemia   . Hypertension   . Thyroid disease    Past Surgical History:  Procedure Laterality Date  . PROSTATE SURGERY  2012   Biopsy x2  . TONSILLECTOMY  1965  . Tumor Removed Left 05/2014   Family History  Problem Relation Age of Onset  . Diabetes Sister   . Depression Sister   . Heart attack Mother   . Prostate cancer Brother   . Melanoma Brother   . Lung cancer Maternal Grandmother   . Diabetes Brother    Allergies  Allergen Reactions  . Yellow Jacket Venom     Current Outpatient Medications:  .  cephALEXin (KEFLEX) 500 MG capsule, Take by mouth., Disp: , Rfl:  .  ibuprofen (ADVIL) 800 MG tablet, Take by mouth., Disp: , Rfl:  .  oxyCODONE (OXY IR/ROXICODONE) 5 MG immediate release tablet, Take by mouth., Disp: , Rfl:  .  PARoxetine (PAXIL) 10 MG tablet, Take 1 tablet by mouth daily., Disp: 90 tablet, Rfl: 3  Review of Systems  Social History   Tobacco Use  . Smoking status: Never Smoker  . Smokeless tobacco: Never Used  Substance Use Topics  . Alcohol use: Yes      Objective:   BP (!) 185/109 (BP Location: Right Arm, Patient Position: Sitting, Cuff Size: Large)   Pulse 86   Temp (!) 97.5 F (36.4 C) (Other (Comment))   Resp  16   Ht 5\' 8"  (1.727 m)   Wt 204 lb (92.5 kg)   SpO2 99%   BMI 31.02 kg/m  Vitals:   08/24/19 0951  BP: (!) 185/109  Pulse: 86  Resp: 16  Temp: (!) 97.5 F (36.4 C)  TempSrc: Other (Comment)  SpO2: 99%  Weight: 204 lb (92.5 kg)  Height: 5\' 8"  (1.727 m)  Body mass index is 31.02 kg/m.   Physical Exam Constitutional:      General: He is not in acute distress.    Appearance: He is well-developed.  HENT:     Head: Normocephalic and atraumatic.     Right Ear: Hearing normal.     Left Ear: Hearing normal.     Nose: Nose normal.  Eyes:     General: Lids are normal. No scleral icterus.       Right eye: No discharge.        Left eye: No discharge.     Conjunctiva/sclera: Conjunctivae normal.  Cardiovascular:     Rate and Rhythm: Normal rate.     Heart sounds: Normal heart sounds.  Pulmonary:     Effort: Pulmonary effort is normal. No respiratory distress.  Musculoskeletal:        General: Normal range of motion.     Comments: Amputation  of the left thumb just proximal to the IP joint. Dried blood on bandage but hemostasis well maintained. No purulent drainage from the stump. Very tender but good wrist and MCP joint ROM. 0.5 cm raw granulating tissue at the edge of the stump flap dorsally. Flap is essentially viable with small white area at a thin edge dorsally.  Skin:    Findings: No lesion or rash.  Neurological:     Mental Status: He is alert and oriented to person, place, and time.  Psychiatric:        Speech: Speech normal.        Behavior: Behavior normal.        Thought Content: Thought content normal.       Assessment & Plan    1. Traumatic amputation of left thumb, subsequent encounter Amputated the distal portion of the left thumb on 08-17-19 with a table saw. Was treated by orthopedic surgeon at [facility] ER and given Tdap booster on 08-18-19. Stump still painful but no bleeding today. Redressed with Polysporin, Telfa Pad, gauze padding and Coban dressing. Recheck  in 1 week to redress and monitor for infection. Should finish all the Keflex 500 mg QID. May use Ibuprofen 800 mg TID with Extra Strength Tylenol 500 mg 2 tablets TID as needed for pain. May taper down on the Oxycodone 5 mg to use 1 tablet at bedtime if needed.     [name], PA  [facility]

## 2019-08-30 DIAGNOSIS — R69 Illness, unspecified: Secondary | ICD-10-CM | POA: Diagnosis not present

## 2019-08-30 NOTE — Progress Notes (Signed)
Patient: Nathaniel Baker Male    DOB: 01-28-1956   64 y.o.   MRN: [id number] Visit Date: 08/30/2019  Today's Provider: Vernie Murders, PA    Subjective:     HPI   Patient presents today for a recheck of his thumb.    1. Traumatic amputation of left thumb, subsequent encounter 08/24/19 Amputated the distal portion of the left thumb on 08-17-19 with a table saw. Was treated by orthopedic surgeon at [facility] ER and given Tdap booster on 08-18-19. Stump still painful but no bleeding today. Redressed with Polysporin, Telfa Pad, gauze padding and Coban dressing. Recheck in 1 week to redress and monitor for infection. Should finish all the Keflex 500 mg QID. May use Ibuprofen 800 mg TID with Extra Strength Tylenol 500 mg 2 tablets TID as needed for pain. May taper down on the Oxycodone 5 mg to use 1 tablet at bedtime if needed.  Past Medical History:  Diagnosis Date  . Depression   . Hyperlipidemia   . Hypertension   . Thyroid disease    Patient Active Problem List   Diagnosis Date Noted  . Strain of rotator cuff capsule 05/04/2016  . Shoulder pain 05/04/2016  . Spondylolisthesis of lumbar region 06/24/2015  . Elevated prostate specific antigen (PSA) 06/12/2015  . Insect sting 06/12/2015  . Allergic rhinitis 06/12/2015  . 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 Elevated blood-pressure reading without diagnosis of hypertension 06/12/2015  . Tachycardia 06/12/2015  . History of throat cancer 06/12/2015  . Hyperthyroidism 06/12/2015  . Adenopathy 06/12/2015  . 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 Avitaminosis D 06/12/2015  . 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 Clinical depression 03/27/2009  . Hypercholesterolemia without hypertriglyceridemia 03/27/2009   Past Surgical History:  Procedure Laterality Date  . PROSTATE SURGERY  2012   Biopsy x2  . TONSILLECTOMY  1965  . Tumor Removed Left 05/2014   Family History    Problem Relation Age of Onset  . Diabetes Sister   . Depression Sister   . Heart attack Mother   . Prostate cancer Brother   . Melanoma Brother   . Lung cancer Maternal Grandmother   . Diabetes Brother    Allergies  Allergen Reactions  . Yellow Jacket Venom     Current Outpatient Medications:  .  ibuprofen (ADVIL) 800 MG tablet, Take by mouth., Disp: , Rfl:  .  PARoxetine (PAXIL) 10 MG tablet, Take 1 tablet by mouth daily., Disp: 90 tablet, Rfl: 3  Review of Systems  Constitutional: Negative.   HENT: Negative.   Respiratory: Negative.   Cardiovascular: Negative.   Gastrointestinal: Negative.   Musculoskeletal: Positive for gait problem.       Amputation of the thumb.    Social History   Tobacco Use  . Smoking status: Never Smoker  . Smokeless tobacco: Never Used  Substance Use Topics  . Alcohol use: Yes      Objective:   BP (!) 158/102 (BP Location: Right Arm, Patient Position: Sitting, Cuff Size: Large)   Pulse 82   Temp (!) 97.1 F (36.2 C) (Temporal)   Wt 203 lb (92.1 kg)   BMI 30.87 kg/m  Vitals:   08/31/19 0954  BP: (!) 158/102  Pulse: 82  Temp: (!) 97.1 F (36.2 C)  TempSrc: Temporal  Weight: 203 lb (92.1 kg)   Physical Exam Constitutional:      General: He is not in  acute distress.    Appearance: He is well-developed.  HENT:     Head: Normocephalic and atraumatic.     Right Ear: Hearing normal.     Left Ear: Hearing normal.     Nose: Nose normal.  Eyes:     General: Lids are normal. No scleral icterus.       Right eye: No discharge.        Left eye: No discharge.     Conjunctiva/sclera: Conjunctivae normal.  Pulmonary:     Effort: Pulmonary effort is normal. No respiratory distress.  Musculoskeletal:     Comments:  Amputation of the left thumb just proximal to the IP joint. Dried blood on bandage but hemostasis well maintained. No purulent drainage from the stump. Very tender but good wrist and MCP joint ROM. Granulating tissue at the  edge of the stump flap dorsally has dried and flattened. Flap is essentially viable and pink today.  Skin:    Findings: No lesion or rash.  Neurological:     Mental Status: He is alert and oriented to person, place, and time.  Psychiatric:        Speech: Speech normal.        Behavior: Behavior normal.        Thought Content: Thought content normal.       Assessment & Plan    1. Traumatic amputation of left thumb, subsequent encounter Follow up of traumatic amputation by an electric saw on 08-17-19 healing well with less discomfort and no sign of infection. Not having to use much pain medications now. Redressed with polysporin, Telfa, gauze wrap and Coban. Healing well without signs of infection. Recheck in 10 days.    Haywood Pao, PA, have reviewed all documentation for this visit. The documentation on 09/14/19 for the exam, diagnosis, procedures, and orders are all accurate and complete.  [name], PA  [facility]

## 2019-08-31 ENCOUNTER — Encounter: Payer: Self-pay | Admitting: Family Medicine

## 2019-08-31 ENCOUNTER — Ambulatory Visit (INDEPENDENT_AMBULATORY_CARE_PROVIDER_SITE_OTHER): Payer: Medicare HMO | Admitting: Family Medicine

## 2019-08-31 ENCOUNTER — Other Ambulatory Visit: Payer: Self-pay

## 2019-08-31 VITALS — BP 158/102 | HR 82 | Temp 97.1°F | Wt 203.0 lb

## 2019-08-31 DIAGNOSIS — S68012D Complete traumatic metacarpophalangeal amputation of left thumb, subsequent encounter: Secondary | ICD-10-CM | POA: Diagnosis not present

## 2019-09-10 ENCOUNTER — Ambulatory Visit: Payer: Self-pay | Admitting: Family Medicine

## 2019-09-11 ENCOUNTER — Ambulatory Visit (INDEPENDENT_AMBULATORY_CARE_PROVIDER_SITE_OTHER): Payer: Medicare HMO | Admitting: Family Medicine

## 2019-09-11 ENCOUNTER — Other Ambulatory Visit: Payer: Self-pay

## 2019-09-11 ENCOUNTER — Encounter: Payer: Self-pay | Admitting: Family Medicine

## 2019-09-11 VITALS — BP 164/104 | HR 81 | Temp 96.9°F | Wt 199.0 lb

## 2019-09-11 DIAGNOSIS — S68012A Complete traumatic metacarpophalangeal amputation of left thumb, initial encounter: Secondary | ICD-10-CM | POA: Insufficient documentation

## 2019-09-11 DIAGNOSIS — S68012D Complete traumatic metacarpophalangeal amputation of left thumb, subsequent encounter: Secondary | ICD-10-CM | POA: Diagnosis not present

## 2019-09-11 DIAGNOSIS — R030 Elevated blood-pressure reading, without diagnosis of hypertension: Secondary | ICD-10-CM | POA: Diagnosis not present

## 2019-09-11 MED ORDER — LISINOPRIL 5 MG PO TABS: 5.0000 mg | ORAL_TABLET | Freq: Every day | ORAL | 3 refills | Status: DC

## 2019-09-11 NOTE — Progress Notes (Signed)
Established patient visit  I,[name],acting as a scribe for [employer], PA.,have documented all relevant documentation on the behalf of Nathaniel Kern, PA,as directed by  [employer], PA while in the presence of [employer], [place].       Patient: Nathaniel Baker   DOB: [birth year]   64 y.o. Male  MRN: [id number] Visit Date: 09/11/2019  Today's healthcare provider: [name], PA  Subjective:    Chief Complaint  Patient presents with  . Follow-up    re-check thumb   HPI  The patient is a 64 year old male who presents for follow up of his left thumb amputation.  He states it continues to heal well.  He is no longer taking any pain medication.  Patient Active Problem List   Diagnosis Date Noted  . Strain of rotator cuff capsule 05/04/2016  . Shoulder pain 05/04/2016  . Spondylolisthesis of lumbar region 06/24/2015  . Elevated prostate specific antigen (PSA) 06/12/2015  . Insect sting 06/12/2015  . Allergic rhinitis 06/12/2015  . 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 Elevated blood-pressure reading without diagnosis of hypertension 06/12/2015  . Tachycardia 06/12/2015  . History of throat cancer 06/12/2015  . Hyperthyroidism 06/12/2015  . Adenopathy 06/12/2015  . 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 Avitaminosis D 06/12/2015  . 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 Clinical depression 03/27/2009  . Hypercholesterolemia without hypertriglyceridemia 03/27/2009   Past Surgical History:  Procedure Laterality Date  . PROSTATE SURGERY  2012   Biopsy x2  . TONSILLECTOMY  1965  . Tumor Removed Left 05/2014   Allergies  Allergen Reactions  . Yellow Jacket Venom       Family History  Problem Relation Age of Onset  . Diabetes Sister   . Depression Sister   . Heart attack Mother   . Prostate cancer Brother   . Melanoma Brother   . Lung cancer Maternal Grandmother   . Diabetes Brother       Medications: Outpatient Medications Prior to Visit  Medication Sig  . ibuprofen (ADVIL) 800 MG tablet Take by mouth.  Marland Kitchen PARoxetine (PAXIL) 10 MG tablet Take 1 tablet by mouth daily.   No facility-administered medications prior to visit.    Review of Systems  Constitutional: Negative for chills, fatigue and fever.  HENT: Negative for congestion, ear pain and sore throat.   Respiratory: Negative for cough and shortness of breath.   Cardiovascular: Negative for chest pain.  Gastrointestinal: Negative for abdominal pain.  Musculoskeletal:       Thumb   Hypertension: During visit patient was noted to have elevated blood pressure on initial and secondary check.        Objective:    BP (!) 164/104 (BP Location: Right Arm, Patient Position: Sitting, Cuff Size: Normal)   Pulse 81   Temp (!) 96.9 F (36.1 C) (Skin)   Wt 199 lb (90.3 kg)   SpO2 98%   BMI 30.26 kg/m   Vitals:   09/11/19 0938 09/11/19 0959  BP: (!) 160/100 (!) 164/104  Pulse: 81   Temp: (!) 96.9 F (36.1 C)   TempSrc: Skin   SpO2: 98%   Weight: 199 lb (90.3 kg)       Physical Exam Constitutional:      Appearance: Normal appearance. He is normal weight.  HENT:     Head: Normocephalic and atraumatic.     Right Ear: Tympanic membrane, ear canal  and external ear normal.     Left Ear: Tympanic membrane, ear canal and external ear normal.     Nose: Nose normal.     Mouth/Throat:     Mouth: Mucous membranes are moist.     Pharynx: Oropharynx is clear.  Eyes:     Extraocular Movements: Extraocular movements intact.     Conjunctiva/sclera: Conjunctivae normal.     Pupils: Pupils are equal, round, and reactive to light.  Neck:     Thyroid: No thyroid mass, thyromegaly or thyroid tenderness.  Cardiovascular:     Rate and Rhythm: Normal rate and regular rhythm.     Pulses: Normal pulses.     Heart sounds: Normal heart sounds.  Pulmonary:     Effort: Pulmonary effort is normal.     Breath sounds:  Normal breath sounds.  Abdominal:     General: Bowel sounds are normal.  Musculoskeletal:     Comments: Thumb healing, no sign of infection, no drainage.  Patient reports he is starting to use the thumb some.  He has good movement of the stub.  Skin:    General: Skin is warm and dry.  Neurological:     Mental Status: He is alert.        Assessment & Plan:       1. Traumatic amputation of left thumb, subsequent encounter Wound redressed and instruction given.   F/U 3 weeks  2. Elevated blood-pressure reading without diagnosis of hypertension Patient having persistent elevation.  Recommend maintain weight control, follow healthy diet and practice routine exercise. Will start on medication today and recheck on next visit.  - lisinopril (ZESTRIL) 5 MG tablet; Take 1 tablet (5 mg total) by mouth daily.  Dispense: 30 tablet; Refill: 3  I, Lathen Seal, PA, have reviewed all documentation for this visit. The documentation on 09/11/19 for the exam, diagnosis, procedures, and orders are all accurate and complete.   [place] [phone] (phone) [phone] (fax)  [place]

## 2019-09-11 NOTE — Patient Instructions (Signed)
May keep bandage off some and let air get to it.  May clean wound now being certain to dry it well and replace bandage if dirty or wet.

## 2019-09-28 NOTE — Progress Notes (Signed)
Established patient visit  I,[name],acting as a scribe for [employer], PA.,have documented all relevant documentation on the behalf of [name], PA,as directed by  [employer], PA while in the presence of [employer], [place].   Patient: Nathaniel Baker   DOB: [birth year]   64 y.o. Male  MRN: [id number]   Today's healthcare provider: [name], PA   Chief Complaint  Patient presents with  . Hypertension  . Hand Injury    left thumb   Subjective    HPI    1. Traumatic amputation of left thumb-Patient was last seen on 09/11/19.  At that time his wound redressed and instruction on care were given. He was advised to F/U 3 weeks. Patient states he is doing well.  He has stopped using the ointment and let the thumb air dry some every day.  This seems to be helping it quite a bit.  2. Elevated blood-pressure reading without diagnosis of hypertension--patient was last seen on 09/11/19.  He was having persistently elevated blood pressure readings.  Treatment at the time of the last visit included; Recommendation for maintaining good weight control, follow healthy diet and practice routine exercise.  He was started on Lisinopril 5 mg and instructed to return today for recheck. Patient has been checking his blood pressure at home and has been getting normal readings.  He usually takes it early morning before eating or drinking coffee.  He states he thinks he has white coat syndrome and has not actually started taking the Lisinopril. BP reading at home was 126/76 today.   BP Readings from Last 3 Encounters:  10/02/19 (!) 168/92  09/11/19 (!) 164/104  08/31/19 (!) 158/102   Wt Readings from Last 3 Encounters:  10/02/19 199 lb (90.3 kg)  09/11/19 199 lb (90.3 kg)  08/31/19 203 lb (92.1 kg)   Lab Results  Component Value Date   CREATININE 1.07 08/17/2019   BUN 14 08/17/2019   NA 138 08/17/2019   K 3.7 08/17/2019   CL 106 08/17/2019   CO2 22 08/17/2019      3. Elevated White Blood Cell Count- on the day of his injury the patient was found to have an elevated WBC of 13.7 with absolute neutrophils of 10.1.  He was given antibiotics after his surgery but has not had any follow up labs.   Past Medical History:  Diagnosis Date  . Depression   . Hyperlipidemia   . Hypertension   . Thyroid disease    Past Surgical History:  Procedure Laterality Date  . PROSTATE SURGERY  2012   Biopsy x2  . TONSILLECTOMY  1965  . Tumor Removed Left 05/2014   Family History  Problem Relation Age of Onset  . Diabetes Sister   . Depression Sister   . Heart attack Mother   . Prostate cancer Brother   . Melanoma Brother   . Lung cancer Maternal Grandmother   . Diabetes Brother    Allergies  Allergen Reactions  . Yellow Jacket Venom    Medications: Outpatient Medications Prior to Visit  Medication Sig  . PARoxetine (PAXIL) 10 MG tablet Take 1 tablet by mouth daily.  Marland Kitchen ibuprofen (ADVIL) 800 MG tablet Take by mouth.  Marland Kitchen lisinopril (ZESTRIL) 5 MG tablet Take 1 tablet (5 mg total) by mouth daily. (Patient not taking: Reported on 10/02/2019)   No facility-administered medications prior to visit.    Review of Systems  Constitutional: Negative for fever.  Respiratory:  Negative for cough and shortness of breath.   Cardiovascular: Negative for chest pain.  Neurological: Negative for dizziness and headaches.      Objective    BP (!) 168/92 (BP Location: Right Arm, Patient Position: Sitting, Cuff Size: Normal)   Pulse 90   Temp (!) 96.9 F (36.1 C) (Skin)   Wt 199 lb (90.3 kg)   SpO2 99%   BMI 30.26 kg/m    Physical Exam Constitutional:      General: He is not in acute distress.    Appearance: He is well-developed.  HENT:     Head: Normocephalic and atraumatic.     Right Ear: Hearing normal.     Left Ear: Hearing normal.     Nose: Nose normal.  Eyes:     General: Lids are normal. No scleral icterus.       Right eye: No discharge.         Left eye: No discharge.     Conjunctiva/sclera: Conjunctivae normal.  Pulmonary:     Effort: Pulmonary effort is normal. No respiratory distress.  Musculoskeletal:        General: Normal range of motion.     Comments: Swelling of the left thumb stump has abated. Scab is dry without drainage. No erythema.   Skin:    Findings: No lesion or rash.  Neurological:     Mental Status: He is alert and oriented to person, place, and time.  Psychiatric:        Speech: Speech normal.        Behavior: Behavior normal.        Thought Content: Thought content normal.      Assessment & Plan     1. Traumatic amputation of left thumb, subsequent encounter Stump healing well without erythema and swelling has resolved. Less sensitivity and switched to dry dressing for protection now. May shower and wash hands. Dry stump well after washing and recheck in 3 months.  2. Elevated blood-pressure reading without diagnosis of hypertension Much improved BP readings at home. Has not started the Lisinopril yet. Continue to monitor and follow up in 3 months.   No follow-ups on file.      Andres Shad, PA, have reviewed all documentation for this visit. The documentation on 10/02/19 for the exam, diagnosis, procedures, and orders are all accurate and complete.    Vernie Murders, [place] [phone] (phone) [phone] (fax)  [place]

## 2019-10-02 ENCOUNTER — Other Ambulatory Visit: Payer: Self-pay

## 2019-10-02 ENCOUNTER — Ambulatory Visit (INDEPENDENT_AMBULATORY_CARE_PROVIDER_SITE_OTHER): Payer: Medicare HMO | Admitting: Family Medicine

## 2019-10-02 VITALS — BP 168/92 | HR 90 | Temp 96.9°F | Wt 199.0 lb

## 2019-10-02 DIAGNOSIS — R030 Elevated blood-pressure reading, without diagnosis of hypertension: Secondary | ICD-10-CM

## 2019-10-02 DIAGNOSIS — S68012D Complete traumatic metacarpophalangeal amputation of left thumb, subsequent encounter: Secondary | ICD-10-CM | POA: Diagnosis not present

## 2019-10-17 DIAGNOSIS — R69 Illness, unspecified: Secondary | ICD-10-CM | POA: Diagnosis not present

## 2019-10-19 ENCOUNTER — Ambulatory Visit: Payer: Medicare HMO | Attending: Internal Medicine

## 2019-10-19 DIAGNOSIS — Z23 Encounter for immunization: Secondary | ICD-10-CM

## 2019-10-19 NOTE — Progress Notes (Signed)
   Covid-19 Vaccination Clinic  Name:  Nathaniel Baker    MRN: [id number] DOB: 22-Feb-1956  10/19/2019  [name] was observed post Covid-19 immunization for 15 minutes without incident. He was provided with Vaccine Information Sheet and instruction to access the V-Safe system.   [name] was instructed to call 911 with any severe reactions post vaccine: Marland Kitchen Difficulty breathing  . Swelling of face and throat  . A fast heartbeat  . A bad rash all over body  . Dizziness and weakness   Immunizations Administered    Name Date Dose VIS Date Route   Pfizer COVID-19 Vaccine 10/19/2019 10:19 AM 0.3 mL 07/25/2018 Intramuscular   Manufacturer: [employer], [employer]   Lot: [id number]   NDC: [address]-2

## 2019-11-13 ENCOUNTER — Ambulatory Visit: Payer: Self-pay

## 2019-11-15 DIAGNOSIS — Z20822 Contact with and (suspected) exposure to covid-19: Secondary | ICD-10-CM | POA: Diagnosis not present

## 2019-11-15 DIAGNOSIS — I10 Essential (primary) hypertension: Secondary | ICD-10-CM | POA: Diagnosis not present

## 2019-11-15 DIAGNOSIS — Z03818 Encounter for observation for suspected exposure to other biological agents ruled out: Secondary | ICD-10-CM | POA: Diagnosis not present

## 2019-12-04 ENCOUNTER — Ambulatory Visit: Payer: Medicare HMO | Attending: Internal Medicine

## 2019-12-04 DIAGNOSIS — Z23 Encounter for immunization: Secondary | ICD-10-CM

## 2019-12-04 NOTE — Progress Notes (Signed)
   Covid-19 Vaccination Clinic  Name:  Nathaniel Baker    MRN: [id number] DOB: 05-17-1956  12/04/2019  [name] was observed post Covid-19 immunization for 15 minutes without incident. He was provided with Vaccine Information Sheet and instruction to access the V-Safe system.   [name] was instructed to call 911 with any severe reactions post vaccine: Marland Kitchen Difficulty breathing  . Swelling of face and throat  . A fast heartbeat  . A bad rash all over body  . Dizziness and weakness   Immunizations Administered    Name Date Dose VIS Date Route   Pfizer COVID-19 Vaccine 12/04/2019  3:18 PM 0.3 mL 07/25/2018 Intramuscular   Manufacturer: [employer], [employer]   Lot: [id number]   NDC: [address]-3

## 2019-12-07 DIAGNOSIS — R69 Illness, unspecified: Secondary | ICD-10-CM | POA: Diagnosis not present

## 2020-02-20 NOTE — Progress Notes (Addendum)
Subjective:   Nathaniel Baker is a 64 y.o. male who presents for an Initial Medicare Annual Wellness Visit.  I connected with Nathaniel Baker today by telephone and verified that I am speaking with the correct person using two identifiers. Location patient: home Location provider: work Persons participating in the virtual visit: patient, provider.   I discussed the limitations, risks, security and privacy concerns of performing an evaluation and management service by telephone and the availability of in person appointments. I also discussed with the patient that there may be a patient responsible charge related to this service. The patient expressed understanding and verbally consented to this telephonic visit.    Interactive audio and video telecommunications were attempted between this provider and patient, however failed, due to patient having technical difficulties OR patient did not have access to video capability.  We continued and completed visit with audio only.   Review of Systems    N/A  Cardiac Risk Factors include: advanced age (>21men, >72 women);male gender;hypertension     Objective:    Today's Vitals   02/21/20 0934  PainSc: 5    There is no height or weight on file to calculate BMI.  Advanced Directives 02/21/2020 08/17/2019 03/19/2015  Does Patient Have a Medical Advance Directive? Yes No No  Type of Estate agent of [place];Living will - -  Copy of Healthcare Power of Attorney in Chart? No - copy requested - -  Would patient like information on creating a medical advance directive? - - No - patient declined information    Current Medications (verified) Outpatient Encounter Medications as of 02/21/2020  Medication Sig   acetaminophen (TYLENOL) 500 MG tablet Take 500 mg by mouth every 6 (six) hours as needed.   ibuprofen (ADVIL) 200 MG tablet Take 200 mg by mouth every 6 (six) hours as needed.   PARoxetine (PAXIL) 10 MG tablet Take 1 tablet  by mouth daily.   ibuprofen (ADVIL) 800 MG tablet Take by mouth. (Patient not taking: Reported on 02/21/2020)   lisinopril (ZESTRIL) 5 MG tablet Take 1 tablet (5 mg total) by mouth daily. (Patient not taking: Reported on 10/02/2019)   No facility-administered encounter medications on file as of 02/21/2020.    Allergies (verified) Yellow jacket venom   History: Past Medical History:  Diagnosis Date   Depression    Hyperlipidemia    Hypertension    Thyroid disease    Past Surgical History:  Procedure Laterality Date   PROSTATE SURGERY  2012   Biopsy x2   THUMB AMPUTATION     TONSILLECTOMY  1965   Tumor Removed Left 05/2014   Family History  Problem Relation Age of Onset   Diabetes Sister    Depression Sister    Heart attack Mother    Prostate cancer Brother    Melanoma Brother    Lung cancer Maternal Grandmother    Diabetes Brother    Social History   Socioeconomic History   Marital status: Married    Spouse name: Not on file   Number of children: 2   Years of education: Not on file   Highest education level: High school graduate  Occupational History   Occupation: retired  Tobacco Use   Smoking status: Never Smoker   Smokeless tobacco: Never Used  Building services engineer Use: Never used  Substance and Sexual Activity   Alcohol use: Yes    Alcohol/week: 0.0 - 1.0 standard drinks   Drug use: No  Sexual activity: Not on file  Other Topics Concern   Not on file  Social History Narrative   Not on file   Social Determinants of Health   Financial Resource Strain: Low Risk    Difficulty of Paying Living Expenses: Not hard at all  Food Insecurity: No Food Insecurity   Worried About Running Out of Food in the Last Year: Never true   Ran Out of Food in the Last Year: Never true  Transportation Needs: No Transportation Needs   Lack of Transportation (Medical): No   Lack of Transportation (Non-Medical): No  Physical Activity: Inactive   Days of Exercise per Week: 0  days   Minutes of Exercise per Session: 0 min  Stress: No Stress Concern Present   Feeling of Stress : Only a little  Social Connections: Moderately Integrated   Frequency of Communication with Friends and Family: More than three times a week   Frequency of Social Gatherings with Friends and Family: More than three times a week   Attends Religious Services: More than 4 times per year   Active Member of [employer] or Organizations: No   Attends Engineer, structural: Never   Marital Status: Married    Tobacco Counseling Counseling given: Not Answered   Clinical Intake:  Pre-visit preparation completed: Yes  Pain : 0-10 Pain Score: 5  Pain Type: Chronic pain (Due to spinal issues.) Pain Location: Back Pain Orientation: Lower Pain Descriptors / Indicators: Aching Pain Frequency: Intermittent Pain Relieving Factors: Pt has been using CBD oil and Tylenol ES for pain. Pt also swims in the the summer which helps pain.  Pain Relieving Factors: Pt has been using CBD oil and Tylenol ES for pain. Pt also swims in the the summer which helps pain.  Nutritional Risks: None Diabetes: No  How often do you need to have someone help you when you read instructions, pamphlets, or other written materials from your doctor or pharmacy?: 1 - Never  Diabetic? No  Interpreter Needed?: No  Information entered by :: [facility], LPN   Activities of Daily Living In your present state of health, do you have any difficulty performing the following activities: 02/21/2020  Hearing? N  Vision? Y  Comment Has a cataract that needs to be removed from right eye.  Difficulty concentrating or making decisions? N  Walking or climbing stairs? Y  Comment Avoids steps.  Dressing or bathing? N  Doing errands, shopping? N  Preparing Food and eating ? N  Using the Toilet? N  In the past six months, have you accidently leaked urine? N  Do you have problems with loss of bowel control? N  Managing your  Medications? N  Managing your Finances? N  Housekeeping or managing your Housekeeping? N  Some recent data might be hidden    Patient Care Team: Chrismon, Jodell Cipro, PA as PCP - General (Family Medicine) System, Provider Not In  Indicate any recent Medical Services you may have received from other than [facility] providers in the past year (date may be approximate).     Assessment:   This is a routine wellness examination for Nathaniel Baker.  Hearing/Vision screen No exam data present  Dietary issues and exercise activities discussed: Current Exercise Habits: The patient does not participate in regular exercise at present, Exercise limited by: orthopedic condition(s)  Goals      DIET - DECREASE SODA OR JUICE INTAKE     Continue to work towards cutting out all soda in diet.  Depression Screen PHQ 2/9 Scores 02/21/2020 03/06/2019 09/06/2017 03/22/2017  PHQ - 2 Score 0 0 1 0  PHQ- 9 Score - 1 5 3     Fall Risk Fall Risk  02/21/2020  Falls in the past year? 0  Number falls in past yr: 0  Injury with Fall? 0    Any stairs in or around the home? No  If so, are there any without handrails? No  Home free of loose throw rugs in walkways, pet beds, electrical cords, etc? Yes  Adequate lighting in your home to reduce risk of falls? Yes   ASSISTIVE DEVICES UTILIZED TO PREVENT FALLS:  Life alert? No  Use of a cane, walker or w/c? Yes  Grab bars in the bathroom? No  Shower chair or bench in shower? Yes  Elevated toilet seat or a handicapped toilet? No    Cognitive Function:     6CIT Screen 02/21/2020  What Year? 0 points  What month? 0 points  What time? 0 points  Count back from 20 0 points  Months in reverse 0 points  Repeat phrase 0 points  Total Score 0    Immunizations Immunization History  Administered Date(s) Administered   Influenza Split 06/07/2011   Influenza,inj,Quad PF,6+ Mos 04/17/2013, 03/12/2014, 01/27/2016, 03/22/2017   Influenza-Unspecified 03/25/2015    PFIZER SARS-COV-2 Vaccination 10/19/2019, 12/04/2019   Tdap 06/07/2011, 08/18/2019    TDAP status: Up to date Flu Vaccine status: Declined, Education has been provided regarding the importance of this vaccine but patient still declined. Advised may receive this vaccine at local pharmacy or Health Dept. Aware to provide a copy of the vaccination record if obtained from local pharmacy or Health Dept. Verbalized acceptance and understanding. Covid-19 vaccine status: Completed vaccines  Qualifies for Shingles Vaccine? Yes   Zostavax completed No   Shingrix Completed?: No.    Education has been provided regarding the importance of this vaccine. Patient has been advised to call insurance company to determine out of pocket expense if they have not yet received this vaccine. Advised may also receive vaccine at local pharmacy or Health Dept. Verbalized acceptance and understanding.  Screening Tests Health Maintenance  Topic Date Due   HIV Screening  Never done   INFLUENZA VACCINE  12/30/2019   COLONOSCOPY  06/05/2023   TETANUS/TDAP  08/17/2029   COVID-19 Vaccine  Completed   Hepatitis C Screening  Completed    Health Maintenance  Health Maintenance Due  Topic Date Due   HIV Screening  Never done   INFLUENZA VACCINE  12/30/2019    Colorectal cancer screening: Completed 06/04/13. Repeat every 10 years  Lung Cancer Screening: (Low Dose CT Chest recommended if Age 44-80 years, 30 pack-year currently smoking OR have quit w/in 15years.) does not qualify.   Additional Screening:  Hepatitis C Screening: Up to date  Vision Screening: Recommended annual ophthalmology exams for early detection of glaucoma and other disorders of the eye. Is the patient up to date with their annual eye exam?  Yes  Who is the provider or what is the name of the office in which the patient attends annual eye exams? [facility] If pt is not established with a provider, would they like to be referred to a provider to  establish care? No .   Dental Screening: Recommended annual dental exams for proper oral hygiene  Community Resource Referral / Chronic Care Management: CRR required this visit?  No   CCM required this visit?  No  Plan:     I have personally reviewed and noted the following in the patient's chart:   Medical and social history Use of alcohol, tobacco or illicit drugs  Current medications and supplements Functional ability and status Nutritional status Physical activity Advanced directives List of other physicians Hospitalizations, surgeries, and ER visits in previous 12 months Vitals Screenings to include cognitive, depression, and falls Referrals and appointments  In addition, I have reviewed and discussed with patient certain preventive protocols, quality metrics, and best practice recommendations. A written personalized care plan for preventive services as well as general preventive health recommendations were provided to patient.     Kada Friesen CodyMarkoski, [place]   1/61/09609/23/2021   Nurse Notes: Pt to receive his yearly flu shot at his pharmacy.   Reviewed Nurse Health Advisor's note and plan. Agree with documentation and recommendations.

## 2020-02-21 ENCOUNTER — Ambulatory Visit (INDEPENDENT_AMBULATORY_CARE_PROVIDER_SITE_OTHER): Payer: Medicare HMO

## 2020-02-21 ENCOUNTER — Other Ambulatory Visit: Payer: Self-pay

## 2020-02-21 DIAGNOSIS — Z0000 Encounter for general adult medical examination without abnormal findings: Secondary | ICD-10-CM

## 2020-02-21 NOTE — Patient Instructions (Signed)
Nathaniel Baker , Thank you for taking time to come for your Medicare Wellness Visit. I appreciate your ongoing commitment to your health goals. Please review the following plan we discussed and let me know if I can assist you in the future.   Screening recommendations/referrals: Colonoscopy: Up to date, due 06/2023 Recommended yearly ophthalmology/optometry visit for glaucoma screening and checkup Recommended yearly dental visit for hygiene and checkup  Vaccinations: Influenza vaccine: Currently due Tdap vaccine: Up to date, due 07/2029 Shingles vaccine: Shingrix discussed. Please contact your pharmacy for coverage information.     Advanced directives: Please bring a copy of your POA (Power of Attorney) and/or Living Will to your next appointment.   Conditions/risks identified: Continue to work towards cutting out all soda in diet.   Next appointment: None, declined scheduling any f/u apts at this time.   Preventive Care 40-64 Years, Male Preventive care refers to lifestyle choices and visits with your health care provider that can promote health and wellness. What does preventive care include?  A yearly physical exam. This is also called an annual well check.  Dental exams once or twice a year.  Routine eye exams. Ask your health care provider how often you should have your eyes checked.  Personal lifestyle choices, including:  Daily care of your teeth and gums.  Regular physical activity.  Eating a healthy diet.  Avoiding tobacco and drug use.  Limiting alcohol use.  Practicing safe sex.  Taking low-dose aspirin every day starting at age 47. What happens during an annual well check? The services and screenings done by your health care provider during your annual well check will depend on your age, overall health, lifestyle risk factors, and family history of disease. Counseling  Your health care provider may ask you questions about your:  Alcohol use.  Tobacco  use.  Drug use.  Emotional well-being.  Home and relationship well-being.  Sexual activity.  Eating habits.  Work and work Astronomer. Screening  You may have the following tests or measurements:  Height, weight, and BMI.  Blood pressure.  Lipid and cholesterol levels. These may be checked every 5 years, or more frequently if you are over 59 years old.  Skin check.  Lung cancer screening. You may have this screening every year starting at age 50 if you have a 30-pack-year history of smoking and currently smoke or have quit within the past 15 years.  Fecal occult blood test (FOBT) of the stool. You may have this test every year starting at age 79.  Flexible sigmoidoscopy or colonoscopy. You may have a sigmoidoscopy every 5 years or a colonoscopy every 10 years starting at age 35.  Prostate cancer screening. Recommendations will vary depending on your family history and other risks.  Hepatitis C blood test.  Hepatitis B blood test.  Sexually transmitted disease (STD) testing.  Diabetes screening. This is done by checking your blood sugar (glucose) after you have not eaten for a while (fasting). You may have this done every 1-3 years. Discuss your test results, treatment options, and if necessary, the need for more tests with your health care provider. Vaccines  Your health care provider may recommend certain vaccines, such as:  Influenza vaccine. This is recommended every year.  Tetanus, diphtheria, and acellular pertussis (Tdap, Td) vaccine. You may need a Td booster every 10 years.  Zoster vaccine. You may need this after age 77.  Pneumococcal 13-valent conjugate (PCV13) vaccine. You may need this if you have certain conditions and  have not been vaccinated.  Pneumococcal polysaccharide (PPSV23) vaccine. You may need one or two doses if you smoke cigarettes or if you have certain conditions. Talk to your health care provider about which screenings and vaccines you  need and how often you need them. This information is not intended to replace advice given to you by your health care provider. Make sure you discuss any questions you have with your health care provider. Document Released: 06/13/2015 Document Revised: 02/04/2016 Document Reviewed: 03/18/2015 Elsevier Interactive Patient Education  2017 [place] Prevention in the Home Falls can cause injuries. They can happen to people of all ages. There are many things you can do to make your home safe and to help prevent falls. What can I do on the outside of my home?  Regularly fix the edges of walkways and driveways and fix any cracks.  Remove anything that might make you trip as you walk through a door, such as a raised step or threshold.  Trim any bushes or trees on the path to your home.  Use bright outdoor lighting.  Clear any walking paths of anything that might make someone trip, such as rocks or tools.  Regularly check to see if handrails are loose or broken. Make sure that both sides of any steps have handrails.  Any raised decks and porches should have guardrails on the edges.  Have any leaves, snow, or ice cleared regularly.  Use sand or salt on walking paths during winter.  Clean up any spills in your garage right away. This includes oil or grease spills. What can I do in the bathroom?  Use night lights.  Install grab bars by the toilet and in the tub and shower. Do not use towel bars as grab bars.  Use non-skid mats or decals in the tub or shower.  If you need to sit down in the shower, use a plastic, non-slip stool.  Keep the floor dry. Clean up any water that spills on the floor as soon as it happens.  Remove soap buildup in the tub or shower regularly.  Attach bath mats securely with double-sided non-slip rug tape.  Do not have throw rugs and other things on the floor that can make you trip. What can I do in the bedroom?  Use night lights.  Make sure  that you have a light by your bed that is easy to reach.  Do not use any sheets or blankets that are too big for your bed. They should not hang down onto the floor.  Have a firm chair that has side arms. You can use this for support while you get dressed.  Do not have throw rugs and other things on the floor that can make you trip. What can I do in the kitchen?  Clean up any spills right away.  Avoid walking on wet floors.  Keep items that you use a lot in easy-to-reach places.  If you need to reach something above you, use a strong step stool that has a grab bar.  Keep electrical cords out of the way.  Do not use floor polish or wax that makes floors slippery. If you must use wax, use non-skid floor wax.  Do not have throw rugs and other things on the floor that can make you trip. What can I do with my stairs?  Do not leave any items on the stairs.  Make sure that there are handrails on both sides of the stairs and  use them. Fix handrails that are broken or loose. Make sure that handrails are as long as the stairways.  Check any carpeting to make sure that it is firmly attached to the stairs. Fix any carpet that is loose or worn.  Avoid having throw rugs at the top or bottom of the stairs. If you do have throw rugs, attach them to the floor with carpet tape.  Make sure that you have a light switch at the top of the stairs and the bottom of the stairs. If you do not have them, ask someone to add them for you. What else can I do to help prevent falls?  Wear shoes that:  Do not have high heels.  Have rubber bottoms.  Are comfortable and fit you well.  Are closed at the toe. Do not wear sandals.  If you use a stepladder:  Make sure that it is fully opened. Do not climb a closed stepladder.  Make sure that both sides of the stepladder are locked into place.  Ask someone to hold it for you, if possible.  Clearly mark and make sure that you can see:  Any grab bars or  handrails.  First and last steps.  Where the edge of each step is.  Use tools that help you move around (mobility aids) if they are needed. These include:  Canes.  Walkers.  Scooters.  Crutches.  Turn on the lights when you go into a dark area. Replace any light bulbs as soon as they burn out.  Set up your furniture so you have a clear path. Avoid moving your furniture around.  If any of your floors are uneven, fix them.  If there are any pets around you, be aware of where they are.  Review your medicines with your doctor. Some medicines can make you feel dizzy. This can increase your chance of falling. Ask your doctor what other things that you can do to help prevent falls. This information is not intended to replace advice given to you by your health care provider. Make sure you discuss any questions you have with your health care provider. Document Released: 03/13/2009 Document Revised: 10/23/2015 Document Reviewed: 06/21/2014 Elsevier Interactive Patient Education  2017 [employer].

## 2020-03-10 ENCOUNTER — Other Ambulatory Visit: Payer: Self-pay | Admitting: Family Medicine

## 2020-03-10 DIAGNOSIS — F32A Depression, unspecified: Secondary | ICD-10-CM

## 2020-05-11 DIAGNOSIS — L03113 Cellulitis of right upper limb: Secondary | ICD-10-CM | POA: Diagnosis not present

## 2020-05-18 DIAGNOSIS — Z1389 Encounter for screening for other disorder: Secondary | ICD-10-CM | POA: Diagnosis not present

## 2020-06-11 ENCOUNTER — Telehealth: Payer: Self-pay

## 2020-06-11 NOTE — Telephone Encounter (Signed)
Copied from CRM [id number]. Topic: Referral - Request for Referral >> Jun 11, 2020 10:02 AM Leafy Ro wrote: Has patient seen PCP for this complaint? Yes Reason for referral: pt left thumb is amputated and he would like a protheses. Pt has [employer]

## 2020-06-12 NOTE — Telephone Encounter (Signed)
Will need appointment to document healing and try to make a referral for a prosthesis. Medicare usually wants full documentation for any durable medical equipment.

## 2020-06-17 NOTE — Telephone Encounter (Signed)
Appointment has been made

## 2020-06-19 ENCOUNTER — Other Ambulatory Visit: Payer: Self-pay

## 2020-06-19 ENCOUNTER — Encounter: Payer: Self-pay | Admitting: Family Medicine

## 2020-06-19 ENCOUNTER — Ambulatory Visit (INDEPENDENT_AMBULATORY_CARE_PROVIDER_SITE_OTHER): Payer: Medicare HMO | Admitting: Family Medicine

## 2020-06-19 VITALS — BP 138/88 | HR 78 | Temp 98.1°F | Resp 16 | Wt 195.0 lb

## 2020-06-19 DIAGNOSIS — S68012D Complete traumatic metacarpophalangeal amputation of left thumb, subsequent encounter: Secondary | ICD-10-CM

## 2020-06-19 NOTE — Progress Notes (Signed)
Established patient visit   Patient: Nathaniel Baker   DOB: January 13, 1956   65 y.o. Male  MRN: [id number] Visit Date: 06/19/2020  Today's healthcare provider: [name], PA-C   Chief Complaint  Patient presents with  . Hand Injury   Subjective    HPI  S/P left thumb amputation: Patient is here for a face to face evaluation for a Thumb prothesis.Patient underwent a left thumb amputation on  08/17/2019. He has difficulty grabbing and picking up objects. He would like to have a prothesis to help with gripping.   Past Medical History:  Diagnosis Date  . Depression   . Hyperlipidemia   . Hypertension   . Thyroid disease    Past Surgical History:  Procedure Laterality Date  . PROSTATE SURGERY  2012   Biopsy x2  . THUMB AMPUTATION    . TONSILLECTOMY  1965  . Tumor Removed Left 05/2014   Social History   Tobacco Use  . Smoking status: Never Smoker  . Smokeless tobacco: Never Used  Vaping Use  . Vaping Use: Never used  Substance Use Topics  . Alcohol use: Yes    Alcohol/week: 0.0 - 1.0 standard drinks  . Drug use: No   Family History  Problem Relation Age of Onset  . Diabetes Sister   . Depression Sister   . Heart attack Mother   . Prostate cancer Brother   . Melanoma Brother   . Lung cancer Maternal Grandmother   . Diabetes Brother    Allergies  Allergen Reactions  . Yellow Jacket Venom        Medications: Outpatient Medications Prior to Visit  Medication Sig  . acetaminophen (TYLENOL) 500 MG tablet Take 500 mg by mouth every 6 (six) hours as needed.  Marland Kitchen PARoxetine (PAXIL) 10 MG tablet TAKE ONE TABLET BY MOUTH DAILY  . lisinopril (ZESTRIL) 5 MG tablet Take 1 tablet (5 mg total) by mouth daily. (Patient not taking: No sig reported)  . [DISCONTINUED] ibuprofen (ADVIL) 200 MG tablet Take 200 mg by mouth every 6 (six) hours as needed. (Patient not taking: Reported on 06/19/2020)  . [DISCONTINUED] ibuprofen (ADVIL) 800 MG tablet Take by mouth. (Patient  not taking: No sig reported)   No facility-administered medications prior to visit.    Review of Systems  Musculoskeletal:       Severely limited left hand function due to thumb amputation.  All other systems reviewed and are negative.      Objective    BP 138/88 (BP Location: Right Arm, Patient Position: Sitting, Cuff Size: Large)   Pulse 78   Temp 98.1 F (36.7 C) (Temporal)   Resp 16   Wt 195 lb (88.5 kg)   BMI 29.65 kg/m     Physical Exam Constitutional:      General: He is not in acute distress.    Appearance: He is well-developed and well-nourished.  HENT:     Head: Normocephalic and atraumatic.     Right Ear: Hearing normal.     Left Ear: Hearing normal.     Nose: Nose normal.  Eyes:     General: Lids are normal. No scleral icterus.       Right eye: No discharge.        Left eye: No discharge.     Conjunctiva/sclera: Conjunctivae normal.  Pulmonary:     Effort: Pulmonary effort is normal. No respiratory distress.  Musculoskeletal:        General: Normal range  of motion.     Comments: Well healed left thumb stump with severely limited function of the left hand. All other digits are normal with good muscle strength. Unable to oppose left thumb stump.  Skin:    General: Skin is intact.     Findings: No lesion or rash.  Neurological:     Mental Status: He is alert and oriented to person, place, and time.  Psychiatric:        Mood and Affect: Mood and affect normal.        Speech: Speech normal.        Behavior: Behavior normal.        Thought Content: Thought content normal.    No results found for any visits on 06/19/20.  Assessment & Plan     1. 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 Unable to hold much of anything without the thumb. Referral for a prosthetic thumb. - AMB REFERRAL FOR DME   No follow-ups on file.         [name],  PA-C  [employer] [phone] (phone) [phone] (fax)  [facility]

## 2021-03-10 ENCOUNTER — Telehealth: Payer: Self-pay

## 2021-03-10 NOTE — Telephone Encounter (Signed)
Copied from CRM [id number]. Topic: General - Inquiry >> Mar 10, 2021 11:46 AM Aretta Nip wrote:  Disp Refills Start End  PARoxetine (PAXIL) 10 MG tablet 90 tablet 3 03/10/2020   Sig: TAKE ONE TABLET BY MOUTH DAILY  Sent to pharmacy as: PARoxetine (PAXIL) 10 MG tablet  E-Prescribing Status: Receipt confirmed by pharmacy (03/10/2020 8:12 AM EDT)  Renewals Pt has called and states that pharmacy is waiting for approval, trying to speed it up.concerned as Maurine Minister is gone. Pt not seen sine 1/22, offered appt, states that he was not available right now to make appt but if he has to make an appt to please call him back [phone] Preferred Pharmacies    [facility] [id number] [place], [place] - [id number]  Phone:  [phone] Fax:  [phone] >> Mar 10, 2021  1:29 PM Randol Kern wrote: Pt returned call stating that he is at the pharmacy and wants someone to call this in for him now

## 2021-03-12 ENCOUNTER — Other Ambulatory Visit: Payer: Self-pay

## 2021-03-12 DIAGNOSIS — F32A Depression, unspecified: Secondary | ICD-10-CM

## 2021-03-12 MED ORDER — PAROXETINE HCL 10 MG PO TABS: ORAL_TABLET | ORAL | 0 refills | Status: DC

## 2021-04-16 ENCOUNTER — Other Ambulatory Visit: Payer: Self-pay

## 2021-04-16 ENCOUNTER — Other Ambulatory Visit: Payer: Self-pay | Admitting: Family Medicine

## 2021-04-16 ENCOUNTER — Ambulatory Visit (INDEPENDENT_AMBULATORY_CARE_PROVIDER_SITE_OTHER): Payer: Medicare HMO | Admitting: Family Medicine

## 2021-04-16 ENCOUNTER — Encounter: Payer: Self-pay | Admitting: Family Medicine

## 2021-04-16 VITALS — BP 127/80 | HR 77 | Resp 16 | Wt 195.8 lb

## 2021-04-16 DIAGNOSIS — R030 Elevated blood-pressure reading, without diagnosis of hypertension: Secondary | ICD-10-CM | POA: Diagnosis not present

## 2021-04-16 DIAGNOSIS — F32A Depression, unspecified: Secondary | ICD-10-CM

## 2021-04-16 DIAGNOSIS — Z6829 Body mass index (BMI) 29.0-29.9, adult: Secondary | ICD-10-CM | POA: Diagnosis not present

## 2021-04-16 DIAGNOSIS — Z23 Encounter for immunization: Secondary | ICD-10-CM | POA: Diagnosis not present

## 2021-04-16 MED ORDER — PAROXETINE HCL 10 MG PO TABS: ORAL_TABLET | ORAL | 3 refills | Status: DC

## 2021-04-16 NOTE — Telephone Encounter (Signed)
Ordered already on 04/16/21, duplicate request, will refuse.  Requested Prescriptions  Refused Prescriptions Disp Refills   PARoxetine (PAXIL) 10 MG tablet [Pharmacy Med Name: PARoxetine HCL 10MG  TABLET] 30 tablet 0    Sig: TAKE ONE TABLET BY MOUTH DAILY     Psychiatry:  Antidepressants - SSRI Failed - 04/16/2021  7:55 AM      Failed - Completed PHQ-2 or PHQ-9 in the last 360 days      Failed - Valid encounter within last 6 months    Recent Outpatient Visits           Today Depression, unspecified depression type   [facility], FNP   10 months ago Traumatic amputation of left thumb, subsequent encounter   [facility] [place], [facility], PA-C   1 year ago Traumatic amputation of left thumb, subsequent encounter   [facility] [place], [facility], PA-C   1 year ago Traumatic amputation of left thumb, subsequent encounter   [facility] [employer], PA-C   1 year ago Traumatic amputation of left thumb, subsequent encounter   [facility] [employer], PA-C

## 2021-04-16 NOTE — Assessment & Plan Note (Signed)
2/2 complete; first dose provided at [facility] pharm per pt report

## 2021-04-16 NOTE — Assessment & Plan Note (Signed)
Chronic, stable On paxil refills sent

## 2021-04-16 NOTE — Progress Notes (Signed)
Established patient visit   Patient: Nathaniel Baker   DOB: January 29, 1956   64 y.o. Male  MRN: [id number] Visit Date: 04/16/2021  Today's healthcare provider: Jacky Kindle, FNP   Chief Complaint  Patient presents with   Depression   Subjective    HPI  Depression, Follow-up  He  was last seen for this 2 years ago. Changes made at last visit include none continue Paxil.   He reports good compliance with treatment. He is not having side effects.   He reports excellent tolerance of treatment. Current symptoms include: depressed mood He feels he is Unchanged since last visit.  Depression screen [facility] 2/9 04/16/2021 02/21/2020 03/06/2019  Decreased Interest 1 0 0  Down, Depressed, Hopeless 1 0 -  PHQ - 2 Score 2 0 0  Altered sleeping - - 0  Tired, decreased energy 1 - 0  Change in appetite - - 0  Feeling bad or failure about yourself  1 - 0  Trouble concentrating 0 - 0  Moving slowly or fidgety/restless 0 - 1  Suicidal thoughts 1 - 0  PHQ-9 Score - - 1  Difficult doing work/chores Somewhat difficult - Not difficult at all    -----------------------------------------------------------------------------------------   Medications: Outpatient Medications Prior to Visit  Medication Sig   acetaminophen (TYLENOL) 500 MG tablet Take 500 mg by mouth every 6 (six) hours as needed.   [DISCONTINUED] lisinopril (ZESTRIL) 5 MG tablet Take 1 tablet (5 mg total) by mouth daily. (Patient not taking: No sig reported)   [DISCONTINUED] PARoxetine (PAXIL) 10 MG tablet TAKE ONE TABLET BY MOUTH DAILY   No facility-administered medications prior to visit.    Review of Systems     Objective    BP 127/80 Comment: home bps  Pulse 77   Resp 16   Wt 195 lb 12.8 oz (88.8 kg)   SpO2 100%   BMI 29.77 kg/m    Physical Exam Vitals and nursing note reviewed.  Constitutional:      Appearance: Normal appearance. He is overweight.  HENT:     Head: Normocephalic and atraumatic.  Eyes:      Pupils: Pupils are equal, round, and reactive to light.  Cardiovascular:     Rate and Rhythm: Normal rate and regular rhythm.     Pulses: Normal pulses.     Heart sounds: Normal heart sounds.  Pulmonary:     Effort: Pulmonary effort is normal.     Breath sounds: Normal breath sounds.  Musculoskeletal:        General: Normal range of motion.     Cervical back: Normal range of motion.  Skin:    General: Skin is warm and dry.     Capillary Refill: Capillary refill takes less than 2 seconds.  Neurological:     General: No focal deficit present.     Mental Status: He is alert and oriented to person, place, and time. Mental status is at baseline.  Psychiatric:        Attention and Perception: Attention normal.        Mood and Affect: Mood and affect normal.        Speech: Speech normal.        Behavior: Behavior is agitated. Behavior is cooperative.        Thought Content: Thought content normal.        Cognition and Memory: Cognition normal.        Judgment: Judgment normal.  Comments: Patient voiced multiple concerns regarding loss in his 15k and general ill in the world. Also, expressed concerns over an argument with a family member at a function over religion.  Voiced concerns over lots of little things, but knows it is all 'out of his control' and 'God's will' but ultimately worried for the wellbeing of his grandchildren.      No results found for any visits on 04/16/21.  Assessment & Plan     Problem List Items Addressed This Visit       Other   Clinical depression - Primary    Chronic, stable On paxil refills sent      Relevant Medications   PARoxetine (PAXIL) 10 MG tablet   Elevated blood-pressure reading without diagnosis of hypertension    Stable home Bps Information provided on DASH diet and lowering stress Reported lets lots of little things bother him and gets easily agitated      Need for shingles vaccine    2/2 complete; first dose provided at [facility]  pharm per pt report      Relevant Orders   Varicella-zoster vaccine IM (Completed)   BMI 29.0-29.9,adult    BMI stable Encouraged weight loss for back complaints and HTN mgmt        Return in about 1 year (around 04/16/2022) for annual examination.      Leilani Merl, FNP, have reviewed all documentation for this visit. The documentation on 04/16/21 for the exam, diagnosis, procedures, and orders are all accurate and complete.    Jacky Kindle, FNP  [facility] [phone] (phone) [phone] (fax)  [facility]

## 2021-04-16 NOTE — Assessment & Plan Note (Signed)
BMI stable Encouraged weight loss for back complaints and HTN mgmt

## 2021-04-16 NOTE — Assessment & Plan Note (Signed)
Stable home Bps Information provided on DASH diet and lowering stress Reported lets lots of little things bother him and gets easily agitated

## 2021-07-07 ENCOUNTER — Telehealth: Payer: Self-pay

## 2021-07-07 NOTE — Telephone Encounter (Signed)
Copied from CRM [id number]. Topic: Complaint - Billing/Coding >> Jul 07, 2021 11:23 AM Randol Kern wrote: DOS: 04/16/2021 Details of complaint: Pt spoke with insurance. Was told it went to part B but it needs to go part D How would the patient like to see this issue resolved? Needs this submitted to part D (not B)   Wants call back from office: [phone]  Route to Research officer, political party.

## 2021-07-07 NOTE — Telephone Encounter (Signed)
Copied from CRM #[id number]. Topic: Complaint - Billing/Coding °>> Jul 07, 2021 11:23 AM Eason, Dominique M wrote: °DOS: 04/16/2021 °Details of complaint: Pt spoke with insurance. Was told it went to part B but it needs to go part D °How would the patient like to see this issue resolved? Needs this submitted to part D (not B)  ° °Wants call back from office: [phone] ° °Route to Practice Administrator. °

## 2021-07-09 NOTE — Telephone Encounter (Signed)
We are not able to file under part D insurance - Part D is pharmacy only.  Checking will billing department to resolve

## 2021-07-09 NOTE — Telephone Encounter (Signed)
See other phone encounter - spoke to patient and emailed billing

## 2021-07-21 ENCOUNTER — Telehealth: Payer: Self-pay

## 2021-07-21 NOTE — Telephone Encounter (Signed)
Copied from CRM [id number]. Topic: Complaint - Billing/Coding >> Jul 07, 2021 11:23 AM Randol Kern wrote: DOS: 04/16/2021 Details of complaint: Pt spoke with insurance. Was told it went to part B but it needs to go part D How would the patient like to see this issue resolved? Needs this submitted to part D (not B)   Wants call back from office: [phone]  Route to Research officer, political party. >> Jul 21, 2021 12:37 PM Payton Spark N wrote: Pt called in to return the call, ,but the office was at lunch, pt requested a call back. Please advise.

## 2021-08-03 NOTE — Telephone Encounter (Signed)
Pt stated he has still not heard back regarding his bill and stated he does not want it to go into collections.  ?

## 2021-08-03 NOTE — Telephone Encounter (Signed)
Pt called back again about his bill for shingle shot for $354 when he could have went to [employer] for $50 / please advise asap before it goes into collections  / pt received another bill / should have been file with Part D instead of Part B / please call pt asap / he has been waiting on this issue for weeks  ?

## 2021-08-04 ENCOUNTER — Telehealth: Payer: Self-pay | Admitting: Family Medicine

## 2021-08-04 NOTE — Telephone Encounter (Signed)
Spoke with patient to schedule AWV.  He asked about the status of his billing issue from visit on Apr 10, 2021.   He is leaving today to attend a funeral in [facility] and will not be back until next Tuesday.   He asked that someone please contact him after next Tuesday August 11, 2021. ?

## 2021-08-12 ENCOUNTER — Ambulatory Visit (INDEPENDENT_AMBULATORY_CARE_PROVIDER_SITE_OTHER): Payer: PPO

## 2021-08-12 VITALS — Ht 68.0 in | Wt 195.0 lb

## 2021-08-12 DIAGNOSIS — Z0000 Encounter for general adult medical examination without abnormal findings: Secondary | ICD-10-CM

## 2021-08-12 NOTE — Patient Instructions (Signed)
Nathaniel Baker , ?Thank you for taking time to come for your Medicare Wellness Visit. I appreciate your ongoing commitment to your health goals. Please review the following plan we discussed and let me know if I can assist you in the future.  ? ?Screening recommendations/referrals: ?Colonoscopy: 06/04/13 ?Recommended yearly ophthalmology/optometry visit for glaucoma screening and checkup ?Recommended yearly dental visit for hygiene and checkup ? ?Vaccinations: ?Influenza vaccine: n/d ?Pneumococcal vaccine: 04/16/21 ?Tdap vaccine: 08/18/19 ?Shingles vaccine: 04/16/21   ?Covid-19: 10/19/19, 12/04/19 ? ?Advanced directives: no ? ?Conditions/risks identified: none ? ?Next appointment: Follow up in one year for your annual wellness visit- declined ? ?Preventive Care 33 Years and Older, Male ?Preventive care refers to lifestyle choices and visits with your health care provider that can promote health and wellness. ?What does preventive care include? ?A yearly physical exam. This is also called an annual well check. ?Dental exams once or twice a year. ?Routine eye exams. Ask your health care provider how often you should have your eyes checked. ?Personal lifestyle choices, including: ?Daily care of your teeth and gums. ?Regular physical activity. ?Eating a healthy diet. ?Avoiding tobacco and drug use. ?Limiting alcohol use. ?Practicing safe sex. ?Taking low doses of aspirin every day. ?Taking vitamin and mineral supplements as recommended by your health care provider. ?What happens during an annual well check? ?The services and screenings done by your health care provider during your annual well check will depend on your age, overall health, lifestyle risk factors, and family history of disease. ?Counseling  ?Your health care provider may ask you questions about your: ?Alcohol use. ?Tobacco use. ?Drug use. ?Emotional well-being. ?Home and relationship well-being. ?Sexual activity. ?Eating habits. ?History of falls. ?Memory and  ability to understand (cognition). ?Work and work Astronomer. ?Screening  ?You may have the following tests or measurements: ?Height, weight, and BMI. ?Blood pressure. ?Lipid and cholesterol levels. These may be checked every 5 years, or more frequently if you are over 72 years old. ?Skin check. ?Lung cancer screening. You may have this screening every year starting at age 70 if you have a 30-pack-year history of smoking and currently smoke or have quit within the past 15 years. ?Fecal occult blood test (FOBT) of the stool. You may have this test every year starting at age 53. ?Flexible sigmoidoscopy or colonoscopy. You may have a sigmoidoscopy every 5 years or a colonoscopy every 10 years starting at age 60. ?Prostate cancer screening. Recommendations will vary depending on your family history and other risks. ?Hepatitis C blood test. ?Hepatitis B blood test. ?Sexually transmitted disease (STD) testing. ?Diabetes screening. This is done by checking your blood sugar (glucose) after you have not eaten for a while (fasting). You may have this done every 1-3 years. ?Abdominal aortic aneurysm (AAA) screening. You may need this if you are a current or former smoker. ?Osteoporosis. You may be screened starting at age 45 if you are at high risk. ?Talk with your health care provider about your test results, treatment options, and if necessary, the need for more tests. ?Vaccines  ?Your health care provider may recommend certain vaccines, such as: ?Influenza vaccine. This is recommended every year. ?Tetanus, diphtheria, and acellular pertussis (Tdap, Td) vaccine. You may need a Td booster every 10 years. ?Zoster vaccine. You may need this after age 64. ?Pneumococcal 13-valent conjugate (PCV13) vaccine. One dose is recommended after age 46. ?Pneumococcal polysaccharide (PPSV23) vaccine. One dose is recommended after age 61. ?Talk to your health care provider about which screenings and vaccines  you need and how often you need  them. ?This information is not intended to replace advice given to you by your health care provider. Make sure you discuss any questions you have with your health care provider. ?Document Released: 06/13/2015 Document Revised: 02/04/2016 Document Reviewed: 03/18/2015 ?Elsevier Interactive Patient Education ? 2017 [place]. ? ?Fall Prevention in the Home ?Falls can cause injuries. They can happen to people of all ages. There are many things you can do to make your home safe and to help prevent falls. ?What can I do on the outside of my home? ?Regularly fix the edges of walkways and driveways and fix any cracks. ?Remove anything that might make you trip as you walk through a door, such as a raised step or threshold. ?Trim any bushes or trees on the path to your home. ?Use bright outdoor lighting. ?Clear any walking paths of anything that might make someone trip, such as rocks or tools. ?Regularly check to see if handrails are loose or broken. Make sure that both sides of any steps have handrails. ?Any raised decks and porches should have guardrails on the edges. ?Have any leaves, snow, or ice cleared regularly. ?Use sand or salt on walking paths during winter. ?Clean up any spills in your garage right away. This includes oil or grease spills. ?What can I do in the bathroom? ?Use night lights. ?Install grab bars by the toilet and in the tub and shower. Do not use towel bars as grab bars. ?Use non-skid mats or decals in the tub or shower. ?If you need to sit down in the shower, use a plastic, non-slip stool. ?Keep the floor dry. Clean up any water that spills on the floor as soon as it happens. ?Remove soap buildup in the tub or shower regularly. ?Attach bath mats securely with double-sided non-slip rug tape. ?Do not have throw rugs and other things on the floor that can make you trip. ?What can I do in the bedroom? ?Use night lights. ?Make sure that you have a light by your bed that is easy to reach. ?Do not use  any sheets or blankets that are too big for your bed. They should not hang down onto the floor. ?Have a firm chair that has side arms. You can use this for support while you get dressed. ?Do not have throw rugs and other things on the floor that can make you trip. ?What can I do in the kitchen? ?Clean up any spills right away. ?Avoid walking on wet floors. ?Keep items that you use a lot in easy-to-reach places. ?If you need to reach something above you, use a strong step stool that has a grab bar. ?Keep electrical cords out of the way. ?Do not use floor polish or wax that makes floors slippery. If you must use wax, use non-skid floor wax. ?Do not have throw rugs and other things on the floor that can make you trip. ?What can I do with my stairs? ?Do not leave any items on the stairs. ?Make sure that there are handrails on both sides of the stairs and use them. Fix handrails that are broken or loose. Make sure that handrails are as long as the stairways. ?Check any carpeting to make sure that it is firmly attached to the stairs. Fix any carpet that is loose or worn. ?Avoid having throw rugs at the top or bottom of the stairs. If you do have throw rugs, attach them to the floor with carpet tape. ?Make sure  that you have a light switch at the top of the stairs and the bottom of the stairs. If you do not have them, ask someone to add them for you. ?What else can I do to help prevent falls? ?Wear shoes that: ?Do not have high heels. ?Have rubber bottoms. ?Are comfortable and fit you well. ?Are closed at the toe. Do not wear sandals. ?If you use a stepladder: ?Make sure that it is fully opened. Do not climb a closed stepladder. ?Make sure that both sides of the stepladder are locked into place. ?Ask someone to hold it for you, if possible. ?Clearly mark and make sure that you can see: ?Any grab bars or handrails. ?First and last steps. ?Where the edge of each step is. ?Use tools that help you move around (mobility aids)  if they are needed. These include: ?Canes. ?Walkers. ?Scooters. ?Crutches. ?Turn on the lights when you go into a dark area. Replace any light bulbs as soon as they burn out. ?Set up your furniture so you have

## 2021-08-12 NOTE — Progress Notes (Signed)
?Virtual Visit via Telephone Note ? ?I connected with  Nathaniel Baker on 08/12/21 at  9:00 AM EDT by telephone and verified that I am speaking with the correct person using two identifiers. ? ?Location: ?Patient: home ?Provider: [facility] ?Persons participating in the virtual visit: patient/Nurse Health Advisor ?  ?I discussed the limitations, risks, security and privacy concerns of performing an evaluation and management service by telephone and the availability of in person appointments. The patient expressed understanding and agreed to proceed. ? ?Interactive audio and video telecommunications were attempted between this nurse and patient, however failed, due to patient having technical difficulties OR patient did not have access to video capability.  We continued and completed visit with audio only. ? ?Some vital signs may be absent or patient reported.  ? ?Hal HopeLorrie S Ilay Capshaw, LPN ? ?Subjective:  ? Nathaniel GainerRobert W Decarolis is a 66 y.o. male who presents for Medicare Annual/Subsequent preventive examination. ? ?Review of Systems    ? ?  ? ?   ?Objective:  ?  ?Today's Vitals  ? 08/12/21 0854  ?Weight: 195 lb (88.5 kg)  ?Height: 5\' 8"  (1.727 m)  ? ?Body mass index is 29.65 kg/m?. ? ?Advanced Directives 02/21/2020 08/17/2019 03/19/2015  ?Does Patient Have a Medical Advance Directive? Yes No No  ?Type of Estate agentAdvance Directive Healthcare Power of Steely HollowAttorney;Living will - -  ?Copy of Healthcare Power of Attorney in Chart? No - copy requested - -  ?Would patient like information on creating a medical advance directive? - - No - patient declined information  ? ? ?Current Medications (verified) ?Outpatient Encounter Medications as of 08/12/2021  ?Medication Sig  ? acetaminophen (TYLENOL) 500 MG tablet Take 500 mg by mouth every 6 (six) hours as needed.  ? ketorolac (ACULAR) 0.5 % ophthalmic solution Place 1 drop into the right eye 4 (four) times daily.  ? moxifloxacin (VIGAMOX) 0.5 % ophthalmic solution Place 1 drop into the right eye 4 (four)  times daily.  ? PARoxetine (PAXIL) 10 MG tablet TAKE ONE TABLET BY MOUTH DAILY  ? prednisoLONE acetate (PRED FORTE) 1 % ophthalmic suspension SMARTSIG:In Eye(s)  ? ?No facility-administered encounter medications on file as of 08/12/2021.  ? ? ?Allergies (verified) ?Yellow jacket venom  ? ?History: ?Past Medical History:  ?Diagnosis Date  ? Depression   ? Hyperlipidemia   ? Hypertension   ? Thyroid disease   ? ?Past Surgical History:  ?Procedure Laterality Date  ? PROSTATE SURGERY  2012  ? Biopsy x2  ? THUMB AMPUTATION    ? TONSILLECTOMY  1965  ? Tumor Removed Left 05/2014  ? ?Family History  ?Problem Relation Age of Onset  ? Diabetes Sister   ? Depression Sister   ? Heart attack Mother   ? Prostate cancer Brother   ? Melanoma Brother   ? Lung cancer Maternal Grandmother   ? Diabetes Brother   ? ?Social History  ? ?Socioeconomic History  ? Marital status: Married  ?  Spouse name: Not on file  ? Number of children: 2  ? Years of education: Not on file  ? Highest education level: High school graduate  ?Occupational History  ? Occupation: retired  ?Tobacco Use  ? Smoking status: Never  ? Smokeless tobacco: Never  ?Vaping Use  ? Vaping Use: Never used  ?Substance and Sexual Activity  ? Alcohol use: Yes  ?  Alcohol/week: 0.0 - 1.0 standard drinks  ? Drug use: No  ? Sexual activity: Not on file  ?Other Topics Concern  ?  Not on file  ?Social History Narrative  ? Not on file  ? ?Social Determinants of Health  ? ?Financial Resource Strain: Not on file  ?Food Insecurity: Not on file  ?Transportation Needs: Not on file  ?Physical Activity: Not on file  ?Stress: Not on file  ?Social Connections: Not on file  ? ? ?Tobacco Counseling ?Counseling given: Not Answered ? ? ?Clinical Intake: ? ?Pre-visit preparation completed: Yes ? ?Pain : No/denies pain ? ?  ? ?Nutritional Risks: None ?Diabetes: No ? ?How often do you need to have someone help you when you read instructions, pamphlets, or other written materials from your doctor or  pharmacy?: 1 - Never ? ?Diabetic?no ? ?Interpreter Needed?: No ? ?Information entered by :: Kennedy Bucker, LPN ? ? ?Activities of Daily Living ?No flowsheet data found. ? ?Patient Care Team: ?Chrismon, Jodell Cipro, PA-C (Inactive) as PCP - General (Family Medicine) ?System, Provider Not In ? ?Indicate any recent Medical Services you may have received from other than [facility] providers in the past year (date may be approximate). ? ?   ?Assessment:  ? This is a routine wellness examination for Nathaniel Baker. ? ?Hearing/Vision screen ?No results found. ? ?Dietary issues and exercise activities discussed: ?  ? ? Goals Addressed   ?None ?  ? ?Depression Screen ?PHQ 2/9 Scores 04/16/2021 02/21/2020 03/06/2019 09/06/2017 03/22/2017  ?PHQ - 2 Score 2 0 0 1 0  ?PHQ- 9 Score - - 1 5 3   ?  ?Fall Risk ?Fall Risk  02/21/2020  ?Falls in the past year? 0  ?Number falls in past yr: 0  ?Injury with Fall? 0  ? ? ?FALL RISK PREVENTION PERTAINING TO THE HOME: ? ?Any stairs in or around the home? No  ?If so, are there any without handrails? No  ?Home free of loose throw rugs in walkways, pet beds, electrical cords, etc? Yes  ?Adequate lighting in your home to reduce risk of falls? Yes  ? ?ASSISTIVE DEVICES UTILIZED TO PREVENT FALLS: ? ?Life alert? No  ?Use of a cane, walker or w/c? Yes  ?Grab bars in the bathroom? No  ?Shower chair or bench in shower? Yes  ?Elevated toilet seat or a handicapped toilet? No  ? ?Cognitive Function:  ?  ?6CIT Screen 02/21/2020  ?What Year? 0 points  ?What month? 0 points  ?What time? 0 points  ?Count back from 20 0 points  ?Months in reverse 0 points  ?Repeat phrase 0 points  ?Total Score 0  ? ? ?Immunizations ?Immunization History  ?Administered Date(s) Administered  ? Influenza Split 06/07/2011  ? Influenza,inj,Quad PF,6+ Mos 04/17/2013, 03/12/2014, 01/27/2016, 03/22/2017  ? Influenza-Unspecified 03/25/2015  ? PFIZER(Purple Top)SARS-COV-2 Vaccination 10/19/2019, 12/04/2019  ? Tdap 06/07/2011, 08/18/2019  ? Zoster Recombinat  (Shingrix) 04/16/2021  ? ? ?TDAP status: Up to date ? ?Flu Vaccine status: Declined, Education has been provided regarding the importance of this vaccine but patient still declined. Advised may receive this vaccine at local pharmacy or Health Dept. Aware to provide a copy of the vaccination record if obtained from local pharmacy or Health Dept. Verbalized acceptance and understanding. ? ?Pneumococcal vaccine status: Declined,  Education has been provided regarding the importance of this vaccine but patient still declined. Advised may receive this vaccine at local pharmacy or Health Dept. Aware to provide a copy of the vaccination record if obtained from local pharmacy or Health Dept. Verbalized acceptance and understanding.  ? ?Covid-19 vaccine status: Completed vaccines ? ?Qualifies for Shingles Vaccine? Yes   ?Zostavax  completed No   ?Shingrix Completed?: Yes ? ?Screening Tests ?Health Maintenance  ?Topic Date Due  ? COVID-19 Vaccine (3 - Booster for Pfizer series) 01/29/2020  ? Pneumonia Vaccine 58+ Years old (1 - PCV) Never done  ? Zoster Vaccines- Shingrix (2 of 2) 06/11/2021  ? INFLUENZA VACCINE  08/28/2021 (Originally 12/29/2020)  ? COLONOSCOPY (Pts 45-2yrs Insurance coverage will need to be confirmed)  06/05/2023  ? TETANUS/TDAP  08/17/2029  ? Hepatitis C Screening  Completed  ? HPV VACCINES  Aged Out  ? ? ?Health Maintenance ? ?Health Maintenance Due  ?Topic Date Due  ? COVID-19 Vaccine (3 - Booster for Pfizer series) 01/29/2020  ? Pneumonia Vaccine 39+ Years old (1 - PCV) Never done  ? Zoster Vaccines- Shingrix (2 of 2) 06/11/2021  ? ? ?Colorectal cancer screening: Type of screening: Colonoscopy. Completed 06/04/13. Repeat every 10 years ? ?Lung Cancer Screening: (Low Dose CT Chest recommended if Age 26-80 years, 30 pack-year currently smoking OR have quit w/in 15years.) does not qualify.  ? ? ?Additional Screening: ? ?Hepatitis C Screening: does qualify; Completed 09/13/12 ? ?Vision Screening: Recommended  annual ophthalmology exams for early detection of glaucoma and other disorders of the eye. ?Is the patient up to date with their annual eye exam?  No  ?Who is the provider or what is the name of the office in

## 2021-08-20 NOTE — Telephone Encounter (Signed)
Pt had spoke with Judson Roch earlier regarding billing issue.,  Pt wants to make sure this is solved/ Pls Fu at [phone] [facility] office but Judson Roch not available told pt she would return call.  ?

## 2021-09-03 NOTE — Telephone Encounter (Signed)
We have spoke with patient and told him per billing - he has another insurance that should be filed.  Gave patient appeal form for him to send in for bill.

## 2022-04-08 ENCOUNTER — Other Ambulatory Visit: Payer: Self-pay | Admitting: Family Medicine

## 2022-04-08 DIAGNOSIS — F32A Depression, unspecified: Secondary | ICD-10-CM

## 2022-04-27 NOTE — Progress Notes (Deleted)
      Established patient visit   Patient: Nathaniel Baker   DOB: 07-Sep-1955   66 y.o. Male  MRN: [id number] Visit Date: 04/29/2022  Today's healthcare provider: Jacky Kindle, FNP   No chief complaint on file.  Subjective    HPI  Depression, Follow-up  He  was last seen for this 1 years ago. Changes made at last visit include no changes.   He reports {excellent/good/fair/poor:19665} compliance with treatment. He {is/is not:21021397} having side effects. ***  He reports {DESC; GOOD/FAIR/POOR:18685} tolerance of treatment. Current symptoms include: {Symptoms; depression:1002} He feels he is {improved/worse/unchanged:3041574} since last visit.     08/12/2021    9:06 AM 04/16/2021   10:08 AM 02/21/2020    9:47 AM  Depression screen PHQ 2/9  Decreased Interest 0 1 0  Down, Depressed, Hopeless 0 1 0  PHQ - 2 Score 0 2 0  Tired, decreased energy  1   Feeling bad or failure about yourself   1   Trouble concentrating  0   Moving slowly or fidgety/restless  0   Suicidal thoughts  1   Difficult doing work/chores  Somewhat difficult     -----------------------------------------------------------------------------------------   Medications: Outpatient Medications Prior to Visit  Medication Sig   acetaminophen (TYLENOL) 500 MG tablet Take 500 mg by mouth every 6 (six) hours as needed.   ketorolac (ACULAR) 0.5 % ophthalmic solution Place 1 drop into the right eye 4 (four) times daily. (Patient not taking: Reported on 08/12/2021)   moxifloxacin (VIGAMOX) 0.5 % ophthalmic solution Place 1 drop into the right eye 4 (four) times daily. (Patient not taking: Reported on 08/12/2021)   PARoxetine (PAXIL) 10 MG tablet TAKE ONE TABLET BY MOUTH DAILY   prednisoLONE acetate (PRED FORTE) 1 % ophthalmic suspension SMARTSIG:In Eye(s) (Patient not taking: Reported on 08/12/2021)   No facility-administered medications prior to visit.    Review of Systems  {Labs  Heme  Chem  Endocrine   Serology  Results Review (optional):23779}   Objective    There were no vitals taken for this visit. {Show previous vital signs (optional):23777}  Physical Exam  ***  No results found for any visits on 04/29/22.  Assessment & Plan     ***  No follow-ups on file.      {provider attestation***:1}   Jacky Kindle, FNP  [facility] [phone] (phone) [phone] (fax)  [facility]

## 2022-04-29 ENCOUNTER — Ambulatory Visit: Payer: PPO | Admitting: Family Medicine

## 2022-04-30 NOTE — Progress Notes (Unsigned)
Established patient visit  Patient: Nathaniel Baker   DOB: December 03, 1955   66 y.o. Male  MRN: [id number] Visit Date: 05/04/2022  Today's healthcare provider: Jacky Kindle, FNP  Re Introduced to nurse practitioner role and practice setting.  All questions answered.  Discussed provider/patient relationship and expectations.  I,Mekiah Cambridge J Ashkan Chamberland,acting as a scribe for Jacky Kindle, FNP.,have documented all relevant documentation on the behalf of Jacky Kindle, FNP,as directed by  Jacky Kindle, FNP while in the presence of Jacky Kindle, FNP.   Chief Complaint  Patient presents with   Depression   Subjective    HPI  Depression, Follow-up  He  was last seen for this 1 years ago. Changes made at last visit include continue paxil 10mg .   He reports excellent compliance with treatment. He is not having side effects.   He reports excellent tolerance of treatment. He feels he is Unchanged since last visit.  Patient is asking for 90 days worth of refills.     05/04/2022    9:48 AM 08/12/2021    9:06 AM 04/16/2021   10:08 AM  Depression screen PHQ 2/9  Decreased Interest 0 0 1  Down, Depressed, Hopeless 1 0 1  PHQ - 2 Score 1 0 2  Altered sleeping 0    Tired, decreased energy 1  1  Change in appetite 0    Feeling bad or failure about yourself  0  1  Trouble concentrating 0  0  Moving slowly or fidgety/restless 0  0  Suicidal thoughts 0  1  PHQ-9 Score 2    Difficult doing work/chores Not difficult at all  Somewhat difficult    -----------------------------------------------------------------------------------------   Medications: Outpatient Medications Prior to Visit  Medication Sig   acetaminophen (TYLENOL) 500 MG tablet Take 500 mg by mouth every 6 (six) hours as needed.   [DISCONTINUED] PARoxetine (PAXIL) 10 MG tablet TAKE ONE TABLET BY MOUTH DAILY   [DISCONTINUED] prednisoLONE acetate (PRED FORTE) 1 % ophthalmic suspension    [DISCONTINUED] ketorolac (ACULAR) 0.5 %  ophthalmic solution Place 1 drop into the right eye 4 (four) times daily. (Patient not taking: Reported on 08/12/2021)   [DISCONTINUED] moxifloxacin (VIGAMOX) 0.5 % ophthalmic solution Place 1 drop into the right eye 4 (four) times daily. (Patient not taking: Reported on 08/12/2021)   No facility-administered medications prior to visit.    Review of Systems    Objective    BP (!) 165/110 (BP Location: Left Arm, Patient Position: Sitting, Cuff Size: Large)   Pulse 80   Resp 16   Ht 5\' 9"  (1.753 m)   Wt 202 lb (91.6 kg)   SpO2 99%   BMI 29.83 kg/m   Physical Exam Vitals and nursing note reviewed.  Constitutional:      Appearance: Normal appearance. He is overweight.  HENT:     Head: Normocephalic and atraumatic.  Eyes:     Pupils: Pupils are equal, round, and reactive to light.  Cardiovascular:     Rate and Rhythm: Normal rate and regular rhythm.     Pulses: Normal pulses.     Heart sounds: Normal heart sounds.  Pulmonary:     Effort: Pulmonary effort is normal.     Breath sounds: Normal breath sounds.  Musculoskeletal:        General: Normal range of motion.     Cervical back: Normal range of motion.  Skin:    General: Skin is warm and dry.  Capillary Refill: Capillary refill takes less than 2 seconds.  Neurological:     General: No focal deficit present.     Mental Status: He is alert and oriented to person, place, and time. Mental status is at baseline.  Psychiatric:        Mood and Affect: Mood normal.        Behavior: Behavior normal.        Thought Content: Thought content normal.        Judgment: Judgment normal.      No results found for any visits on 05/04/22.  Assessment & Plan     Problem List Items Addressed This Visit       Cardiovascular and Mediastinum   Primary hypertension    New diagnosis; has been dx'd as "white coat" for years Home Bps were previously stable; however, has not checked recently Recommend start of 5 mg norvasc with 1 month  f/u      Relevant Medications   amLODipine (NORVASC) 5 MG tablet     Other   Clinical depression - Primary    Chronic, stable Due for refills on paxil 10 mg No complaints F/u 6 months at CPE with labs       Relevant Medications   PARoxetine (PAXIL) 10 MG tablet   Return in about 4 weeks (around 06/01/2022) for HTN management.     Leilani Merl, FNP, have reviewed all documentation for this visit. The documentation on 05/04/22 for the exam, diagnosis, procedures, and orders are all accurate and complete.  Jacky Kindle, FNP  [facility] [phone] (phone) [phone] (fax)  [facility]

## 2022-05-04 ENCOUNTER — Ambulatory Visit (INDEPENDENT_AMBULATORY_CARE_PROVIDER_SITE_OTHER): Payer: PPO | Admitting: Family Medicine

## 2022-05-04 ENCOUNTER — Encounter: Payer: Self-pay | Admitting: Family Medicine

## 2022-05-04 VITALS — BP 165/110 | HR 80 | Resp 16 | Ht 69.0 in | Wt 202.0 lb

## 2022-05-04 DIAGNOSIS — I10 Essential (primary) hypertension: Secondary | ICD-10-CM | POA: Insufficient documentation

## 2022-05-04 DIAGNOSIS — F32A Depression, unspecified: Secondary | ICD-10-CM | POA: Diagnosis not present

## 2022-05-04 MED ORDER — PAROXETINE HCL 10 MG PO TABS: 10.0000 mg | ORAL_TABLET | Freq: Every day | ORAL | 3 refills | Status: DC

## 2022-05-04 MED ORDER — AMLODIPINE BESYLATE 5 MG PO TABS: 5.0000 mg | ORAL_TABLET | Freq: Every day | ORAL | 3 refills | Status: DC

## 2022-05-04 NOTE — Assessment & Plan Note (Signed)
Chronic, stable Due for refills on paxil 10 mg No complaints F/u 6 months at CPE with labs

## 2022-05-04 NOTE — Assessment & Plan Note (Signed)
New diagnosis; has been dx'd as "white coat" for years Home Bps were previously stable; however, has not checked recently Recommend start of 5 mg norvasc with 1 month f/u

## 2022-06-03 ENCOUNTER — Encounter: Payer: Self-pay | Admitting: Family Medicine

## 2022-06-03 ENCOUNTER — Ambulatory Visit (INDEPENDENT_AMBULATORY_CARE_PROVIDER_SITE_OTHER): Payer: PPO | Admitting: Family Medicine

## 2022-06-03 VITALS — BP 145/96 | HR 75 | Temp 98.6°F | Resp 16 | Wt 202.0 lb

## 2022-06-03 DIAGNOSIS — R972 Elevated prostate specific antigen [PSA]: Secondary | ICD-10-CM | POA: Diagnosis not present

## 2022-06-03 DIAGNOSIS — E7800 Pure hypercholesterolemia, unspecified: Secondary | ICD-10-CM

## 2022-06-03 DIAGNOSIS — Z1329 Encounter for screening for other suspected endocrine disorder: Secondary | ICD-10-CM

## 2022-06-03 DIAGNOSIS — R739 Hyperglycemia, unspecified: Secondary | ICD-10-CM | POA: Diagnosis not present

## 2022-06-03 DIAGNOSIS — I10 Essential (primary) hypertension: Secondary | ICD-10-CM

## 2022-06-03 MED ORDER — AMLODIPINE BESYLATE 10 MG PO TABS: 10.0000 mg | ORAL_TABLET | Freq: Every day | ORAL | 3 refills | Status: DC

## 2022-06-03 NOTE — Assessment & Plan Note (Signed)
Recommend A1c screening given concern for HTN, HLD with elevated glucose Hx of PSA elevation with nocturia as well Continue to recommend balanced, lower carb meals. Smaller meal size, adding snacks. Choosing water as drink of choice and increasing purposeful exercise.

## 2022-06-03 NOTE — Assessment & Plan Note (Signed)
Chronic, unknown Not on statin Working on dietary changes and purposeful activity LP today is non fasting

## 2022-06-03 NOTE — Assessment & Plan Note (Signed)
Chronic, unknown Previously followed by urology Denies LUTS at this time; recommend PSA in place of DRE. If PSA is elevated for age, we will repeat; if PSA remains elevated pt will be referred to urology for DRE and next steps for best treatment.

## 2022-06-03 NOTE — Assessment & Plan Note (Signed)
Chronic, improved Further titration of norvasc needed to reach goal of <140/<90 Increase from 5 to 10 mg today Continue to follow up at home; keep upcoming appts Reach out with questions/concerns

## 2022-06-03 NOTE — Progress Notes (Signed)
I,Joseline E Rosas,acting as a scribe for Gwyneth Sprout, FNP.,have documented all relevant documentation on the behalf of Gwyneth Sprout, FNP,as directed by  Gwyneth Sprout, FNP while in the presence of Gwyneth Sprout, FNP.   Established patient visit  Patient: Nathaniel Baker   DOB: 1955/08/13   67 y.o. Male  MRN: [id number] Visit Date: 06/03/2022  Today's healthcare provider: Gwyneth Sprout, FNP  Re Introduced to nurse practitioner role and practice setting.  All questions answered.  Discussed provider/patient relationship and expectations.  Chief Complaint  Patient presents with   Follow-Up HTN   Subjective    HPI  Hypertension, follow-up  BP Readings from Last 3 Encounters:  06/03/22 (!) 145/96  05/04/22 (!) 165/110  04/16/21 127/80   Wt Readings from Last 3 Encounters:  06/03/22 202 lb (91.6 kg)  05/04/22 202 lb (91.6 kg)  08/12/21 195 lb (88.5 kg)     He was last seen for hypertension 1 months ago.  BP at that visit was 165/110. Management since that visit includes Start 5 mg Norvasc.  He reports excellent compliance with treatment.  Outside blood pressures are 130's/70-140/79 142/08 Dec 31 and some 152/93 -148/79 on Jan 02  Symptoms: No chest pain No chest pressure  No palpitations No syncope  No dyspnea No orthopnea  No paroxysmal nocturnal dyspnea No lower extremity edema   Pertinent labs Lab Results  Component Value Date   CHOL 244 (H) 03/16/2019   HDL 48 03/16/2019   LDLCALC 183 (H) 03/16/2019   TRIG 76 03/16/2019   CHOLHDL 5.1 (H) 03/16/2019   Lab Results  Component Value Date   NA 138 08/17/2019   K 3.7 08/17/2019   CREATININE 1.07 08/17/2019   GFRNONAA >60 08/17/2019   GLUCOSE 133 (H) 08/17/2019   TSH 1.600 03/16/2019     The ASCVD Risk score (Arnett DK, et al., 2019) failed to calculate for the following reasons:   Cannot find a previous HDL lab   Cannot find a previous total cholesterol  lab  ---------------------------------------------------------------------------------------------------   Medications: Outpatient Medications Prior to Visit  Medication Sig   acetaminophen (TYLENOL) 500 MG tablet Take 500 mg by mouth every 6 (six) hours as needed.   PARoxetine (PAXIL) 10 MG tablet Take 1 tablet (10 mg total) by mouth daily.   [DISCONTINUED] amLODipine (NORVASC) 5 MG tablet Take 1 tablet (5 mg total) by mouth daily.   No facility-administered medications prior to visit.    Review of Systems    Objective    BP (!) 145/96 (BP Location: Right Arm, Patient Position: Sitting, Cuff Size: Normal)   Pulse 75   Temp 98.6 F (37 C) (Oral)   Resp 16   Wt 202 lb (91.6 kg)   BMI 29.83 kg/m   Physical Exam Vitals and nursing note reviewed.  Constitutional:      Appearance: Normal appearance. He is obese.  HENT:     Head: Normocephalic and atraumatic.  Eyes:     Pupils: Pupils are equal, round, and reactive to light.  Cardiovascular:     Rate and Rhythm: Normal rate and regular rhythm.     Pulses: Normal pulses.     Heart sounds: Normal heart sounds.  Pulmonary:     Effort: Pulmonary effort is normal.     Breath sounds: Normal breath sounds.  Musculoskeletal:        General: No swelling. Normal range of motion.     Cervical back: Normal range  of motion.     Right lower leg: No edema.     Left lower leg: No edema.  Skin:    General: Skin is warm and dry.     Capillary Refill: Capillary refill takes less than 2 seconds.  Neurological:     General: No focal deficit present.     Mental Status: He is alert and oriented to person, place, and time. Mental status is at baseline.  Psychiatric:        Mood and Affect: Mood normal.        Thought Content: Thought content normal.        Judgment: Judgment normal.    No results found for any visits on 06/03/22.  Assessment & Plan     Problem List Items Addressed This Visit       Cardiovascular and Mediastinum    Primary hypertension - Primary    Chronic, improved Further titration of norvasc needed to reach goal of <140/<90 Increase from 5 to 10 mg today Continue to follow up at home; keep upcoming appts Reach out with questions/concerns      Relevant Medications   amLODipine (NORVASC) 10 MG tablet   Other Relevant Orders   CBC   Comprehensive Metabolic Panel (CMET)   TSH     Other   Elevated PSA    Chronic, unknown Previously followed by urology Denies LUTS at this time; recommend PSA in place of DRE. If PSA is elevated for age, we will repeat; if PSA remains elevated pt will be referred to urology for DRE and next steps for best treatment.       Relevant Orders   PSA   Elevated serum glucose    Recommend A1c screening given concern for HTN, HLD with elevated glucose Hx of PSA elevation with nocturia as well Continue to recommend balanced, lower carb meals. Smaller meal size, adding snacks. Choosing water as drink of choice and increasing purposeful exercise.       Relevant Orders   Hemoglobin A1c   Hypercholesterolemia without hypertriglyceridemia    Chronic, unknown Not on statin Working on dietary changes and purposeful activity LP today is non fasting      Relevant Medications   amLODipine (NORVASC) 10 MG tablet   Other Relevant Orders   Lipid panel   Screening for thyroid disorder    Hx of elevated thyroid function in 2017; no recent labs Check TSH in setting of HTN      Relevant Orders   TSH   Return in about 5 months (around 11/02/2022) for annual examination, chonic disease management.     Vonna Kotyk, FNP, have reviewed all documentation for this visit. The documentation on 06/03/22 for the exam, diagnosis, procedures, and orders are all accurate and complete.  Gwyneth Sprout, [place] [phone] (phone) [phone] (fax)  [place]

## 2022-06-03 NOTE — Assessment & Plan Note (Signed)
Hx of elevated thyroid function in 2017; no recent labs Check TSH in setting of HTN

## 2022-06-04 ENCOUNTER — Other Ambulatory Visit: Payer: Self-pay | Admitting: Family Medicine

## 2022-06-04 LAB — CBC
Hematocrit: 45 % (ref 37.5–51.0)
Hemoglobin: 14.5 g/dL (ref 13.0–17.7)
MCH: 26.8 pg (ref 26.6–33.0)
MCHC: 32.2 g/dL (ref 31.5–35.7)
MCV: 83 fL (ref 79–97)
Platelets: 278 10*3/uL (ref 150–450)
RBC: 5.42 x10E6/uL (ref 4.14–5.80)
RDW: 13.5 % (ref 11.6–15.4)
WBC: 7.3 10*3/uL (ref 3.4–10.8)

## 2022-06-04 LAB — COMPREHENSIVE METABOLIC PANEL
ALT: 21 IU/L (ref 0–44)
AST: 20 IU/L (ref 0–40)
Albumin/Globulin Ratio: 1.4 (ref 1.2–2.2)
Albumin: 4.4 g/dL (ref 3.9–4.9)
Alkaline Phosphatase: 53 IU/L (ref 44–121)
BUN/Creatinine Ratio: 13 (ref 10–24)
BUN: 15 mg/dL (ref 8–27)
Bilirubin Total: 0.9 mg/dL (ref 0.0–1.2)
CO2: 23 mmol/L (ref 20–29)
Calcium: 9.6 mg/dL (ref 8.6–10.2)
Chloride: 103 mmol/L (ref 96–106)
Creatinine, Ser: 1.14 mg/dL (ref 0.76–1.27)
Globulin, Total: 3.2 g/dL (ref 1.5–4.5)
Glucose: 95 mg/dL (ref 70–99)
Potassium: 4.6 mmol/L (ref 3.5–5.2)
Sodium: 141 mmol/L (ref 134–144)
Total Protein: 7.6 g/dL (ref 6.0–8.5)
eGFR: 70 mL/min/{1.73_m2} (ref >59)

## 2022-06-04 LAB — HEMOGLOBIN A1C
Est. average glucose Bld gHb Est-mCnc: 120 mg/dL
Hgb A1c MFr Bld: 5.8 % — ABNORMAL HIGH (ref 4.8–5.6)

## 2022-06-04 LAB — LIPID PANEL
Chol/HDL Ratio: 4.7 ratio (ref 0.0–5.0)
Cholesterol, Total: 265 mg/dL — ABNORMAL HIGH (ref 100–199)
HDL: 56 mg/dL (ref >39)
LDL Chol Calc (NIH): 196 mg/dL — ABNORMAL HIGH (ref 0–99)
Triglycerides: 76 mg/dL (ref 0–149)
VLDL Cholesterol Cal: 13 mg/dL (ref 5–40)

## 2022-06-04 LAB — TSH: TSH: 0.762 u[IU]/mL (ref 0.450–4.500)

## 2022-06-04 LAB — PSA: Prostate Specific Ag, Serum: 18.4 ng/mL — ABNORMAL HIGH (ref 0.0–4.0)

## 2022-06-04 MED ORDER — ROSUVASTATIN CALCIUM 40 MG PO TABS: 40.0000 mg | ORAL_TABLET | Freq: Every day | ORAL | 11 refills | Status: DC

## 2022-06-04 MED ORDER — METFORMIN HCL ER 750 MG PO TB24: 750.0000 mg | ORAL_TABLET | Freq: Every day | ORAL | 11 refills | Status: DC

## 2022-06-04 NOTE — Progress Notes (Signed)
Cholesterol remains elevated. Recommend start of Crestor 40 mg to assist with reduction of stroke and heart attack risk. Blood sugar average confirms pre-diabetes. Continue to recommend balanced, lower carb meals. Smaller meal size, adding snacks. Choosing water as drink of choice and increasing purposeful exercise. Could start Metformin XR 750 mg if desired. PSA is markedly increase; up from 7 to 18. Do you still follow up with urology? Normal cell count, chemistry and thyroid.

## 2022-07-21 ENCOUNTER — Telehealth: Payer: Self-pay | Admitting: Family Medicine

## 2022-07-21 NOTE — Telephone Encounter (Signed)
Contacted Belva Crome to schedule their annual wellness visit. Appointment made for 08/25/2022.  [place] Direct Dial: [phone]

## 2022-08-25 ENCOUNTER — Ambulatory Visit (INDEPENDENT_AMBULATORY_CARE_PROVIDER_SITE_OTHER): Payer: PPO

## 2022-08-25 VITALS — BP 120/82 | Ht 69.0 in | Wt 197.1 lb

## 2022-08-25 DIAGNOSIS — Z0000 Encounter for general adult medical examination without abnormal findings: Secondary | ICD-10-CM | POA: Diagnosis not present

## 2022-08-25 NOTE — Patient Instructions (Signed)
Nathaniel Baker , Thank you for taking time to come for your Medicare Wellness Visit. I appreciate your ongoing commitment to your health goals. Please review the following plan we discussed and let me know if I can assist you in the future.   These are the goals we discussed:  Goals      DIET - DECREASE SODA OR JUICE INTAKE     Continue to work towards cutting out all soda in diet.      DIET - EAT MORE FRUITS AND VEGETABLES        This is a list of the screening recommended for you and due dates:  Health Maintenance  Topic Date Due   Zoster (Shingles) Vaccine (2 of 2) 06/11/2021   COVID-19 Vaccine (3 - 2023-24 season) 01/29/2022   Flu Shot  08/29/2022*   Pneumonia Vaccine (1 of 1 - PCV) 05/05/2023*   Colon Cancer Screening  06/05/2023   Medicare Annual Wellness Visit  08/25/2023   DTaP/Tdap/Td vaccine (3 - Td or Tdap) 08/17/2029   Hepatitis C Screening: USPSTF Recommendation to screen - Ages 91-79 yo.  Completed   HPV Vaccine  Aged Out  *Topic was postponed. The date shown is not the original due date.    Advanced directives: no paperwork given for completion  Conditions/risks identified: none  Next appointment: Follow up in one year for your annual wellness visit. 08/30/2023@9 :45 am in person  Preventive Care 64 Years and Older, Male  Preventive care refers to lifestyle choices and visits with your health care provider that can promote health and wellness. What does preventive care include? A yearly physical exam. This is also called an annual well check. Dental exams once or twice a year. Routine eye exams. Ask your health care provider how often you should have your eyes checked. Personal lifestyle choices, including: Daily care of your teeth and gums. Regular physical activity. Eating a healthy diet. Avoiding tobacco and drug use. Limiting alcohol use. Practicing safe sex. Taking low doses of aspirin every day. Taking vitamin and mineral supplements as recommended by  your health care provider. What happens during an annual well check? The services and screenings done by your health care provider during your annual well check will depend on your age, overall health, lifestyle risk factors, and family history of disease. Counseling  Your health care provider may ask you questions about your: Alcohol use. Tobacco use. Drug use. Emotional well-being. Home and relationship well-being. Sexual activity. Eating habits. History of falls. Memory and ability to understand (cognition). Work and work Statistician. Screening  You may have the following tests or measurements: Height, weight, and BMI. Blood pressure. Lipid and cholesterol levels. These may be checked every 5 years, or more frequently if you are over 68 years old. Skin check. Lung cancer screening. You may have this screening every year starting at age 35 if you have a 30-pack-year history of smoking and currently smoke or have quit within the past 15 years. Fecal occult blood test (FOBT) of the stool. You may have this test every year starting at age 32. Flexible sigmoidoscopy or colonoscopy. You may have a sigmoidoscopy every 5 years or a colonoscopy every 10 years starting at age 62. Prostate cancer screening. Recommendations will vary depending on your family history and other risks. Hepatitis C blood test. Hepatitis B blood test. Sexually transmitted disease (STD) testing. Diabetes screening. This is done by checking your blood sugar (glucose) after you have not eaten for a while (fasting). You may  have this done every 1-3 years. Abdominal aortic aneurysm (AAA) screening. You may need this if you are a current or former smoker. Osteoporosis. You may be screened starting at age 29 if you are at high risk. Talk with your health care provider about your test results, treatment options, and if necessary, the need for more tests. Vaccines  Your health care provider may recommend certain vaccines,  such as: Influenza vaccine. This is recommended every year. Tetanus, diphtheria, and acellular pertussis (Tdap, Td) vaccine. You may need a Td booster every 10 years. Zoster vaccine. You may need this after age 80. Pneumococcal 13-valent conjugate (PCV13) vaccine. One dose is recommended after age 52. Pneumococcal polysaccharide (PPSV23) vaccine. One dose is recommended after age 40. Talk to your health care provider about which screenings and vaccines you need and how often you need them. This information is not intended to replace advice given to you by your health care provider. Make sure you discuss any questions you have with your health care provider. Document Released: 06/13/2015 Document Revised: 02/04/2016 Document Reviewed: 03/18/2015 Elsevier Interactive Patient Education  2017 Minor Prevention in the Home Falls can cause injuries. They can happen to people of all ages. There are many things you can do to make your home safe and to help prevent falls. What can I do on the outside of my home? Regularly fix the edges of walkways and driveways and fix any cracks. Remove anything that might make you trip as you walk through a door, such as a raised step or threshold. Trim any bushes or trees on the path to your home. Use bright outdoor lighting. Clear any walking paths of anything that might make someone trip, such as rocks or tools. Regularly check to see if handrails are loose or broken. Make sure that both sides of any steps have handrails. Any raised decks and porches should have guardrails on the edges. Have any leaves, snow, or ice cleared regularly. Use sand or salt on walking paths during winter. Clean up any spills in your garage right away. This includes oil or grease spills. What can I do in the bathroom? Use night lights. Install grab bars by the toilet and in the tub and shower. Do not use towel bars as grab bars. Use non-skid mats or decals in the tub or  shower. If you need to sit down in the shower, use a plastic, non-slip stool. Keep the floor dry. Clean up any water that spills on the floor as soon as it happens. Remove soap buildup in the tub or shower regularly. Attach bath mats securely with double-sided non-slip rug tape. Do not have throw rugs and other things on the floor that can make you trip. What can I do in the bedroom? Use night lights. Make sure that you have a light by your bed that is easy to reach. Do not use any sheets or blankets that are too big for your bed. They should not hang down onto the floor. Have a firm chair that has side arms. You can use this for support while you get dressed. Do not have throw rugs and other things on the floor that can make you trip. What can I do in the kitchen? Clean up any spills right away. Avoid walking on wet floors. Keep items that you use a lot in easy-to-reach places. If you need to reach something above you, use a strong step stool that has a grab bar. Keep electrical cords  out of the way. Do not use floor polish or wax that makes floors slippery. If you must use wax, use non-skid floor wax. Do not have throw rugs and other things on the floor that can make you trip. What can I do with my stairs? Do not leave any items on the stairs. Make sure that there are handrails on both sides of the stairs and use them. Fix handrails that are broken or loose. Make sure that handrails are as long as the stairways. Check any carpeting to make sure that it is firmly attached to the stairs. Fix any carpet that is loose or worn. Avoid having throw rugs at the top or bottom of the stairs. If you do have throw rugs, attach them to the floor with carpet tape. Make sure that you have a light switch at the top of the stairs and the bottom of the stairs. If you do not have them, ask someone to add them for you. What else can I do to help prevent falls? Wear shoes that: Do not have high heels. Have  rubber bottoms. Are comfortable and fit you well. Are closed at the toe. Do not wear sandals. If you use a stepladder: Make sure that it is fully opened. Do not climb a closed stepladder. Make sure that both sides of the stepladder are locked into place. Ask someone to hold it for you, if possible. Clearly mark and make sure that you can see: Any grab bars or handrails. First and last steps. Where the edge of each step is. Use tools that help you move around (mobility aids) if they are needed. These include: Canes. Walkers. Scooters. Crutches. Turn on the lights when you go into a dark area. Replace any light bulbs as soon as they burn out. Set up your furniture so you have a clear path. Avoid moving your furniture around. If any of your floors are uneven, fix them. If there are any pets around you, be aware of where they are. Review your medicines with your doctor. Some medicines can make you feel dizzy. This can increase your chance of falling. Ask your doctor what other things that you can do to help prevent falls. This information is not intended to replace advice given to you by your health care provider. Make sure you discuss any questions you have with your health care provider. Document Released: 03/13/2009 Document Revised: 10/23/2015 Document Reviewed: 06/21/2014 Elsevier Interactive Patient Education  2017 [employer].

## 2022-08-25 NOTE — Progress Notes (Signed)
Subjective:   Nathaniel Baker is a 67 y.o. male who presents for Medicare Annual/Subsequent preventive examination.  Review of Systems    Cardiac Risk Factors include: advanced age (>8men, >95 women);dyslipidemia;hypertension;male gender    Objective:    Today's Vitals   08/25/22 0914  Weight: 197 lb 1.6 oz (89.4 kg)  Height: 5\' 9"  (1.753 m)   Body mass index is 29.11 kg/m.     08/25/2022    9:34 AM 08/12/2021    9:08 AM 02/21/2020    9:52 AM 08/17/2019    7:08 PM 03/19/2015    9:03 AM  Advanced Directives  Does Patient Have a Medical Advance Directive? No Yes Yes No No  Type of Advance Directive  Living will [name];Living will    Does patient want to make changes to medical advance directive?  Yes (Inpatient - patient defers changing a medical advance directive and declines information at this time)     Copy of [place] in Chart?   No - copy requested    Would patient like information on creating a medical advance directive?     No - patient declined information    Current Medications (verified) Outpatient Encounter Medications as of 08/25/2022  Medication Sig   acetaminophen (TYLENOL) 500 MG tablet Take 500 mg by mouth every 6 (six) hours as needed.   amLODipine (NORVASC) 10 MG tablet Take 1 tablet (10 mg total) by mouth daily.   metFORMIN (GLUCOPHAGE-XR) 750 MG 24 hr tablet Take 1 tablet (750 mg total) by mouth daily with breakfast.   PARoxetine (PAXIL) 10 MG tablet Take 1 tablet (10 mg total) by mouth daily.   rosuvastatin (CRESTOR) 40 MG tablet Take 1 tablet (40 mg total) by mouth daily. (Patient not taking: Reported on 08/25/2022)   No facility-administered encounter medications on file as of 08/25/2022.    Allergies (verified) Yellow jacket venom   History: Past Medical History:  Diagnosis Date   Depression    Hyperlipidemia    Hypertension    Thyroid disease    Past Surgical History:  Procedure Laterality Date    PROSTATE SURGERY  2012   Biopsy x2   THUMB AMPUTATION     TONSILLECTOMY  1965   Tumor Removed Left 05/2014   Family History  Problem Relation Age of Onset   Diabetes Sister    Depression Sister    Heart attack Mother    Prostate cancer Brother    Melanoma Brother    Lung cancer Maternal Grandmother    Diabetes Brother    Social History   Socioeconomic History   Marital status: Married    Spouse name: Not on file   Number of children: 2   Years of education: Not on file   Highest education level: High school graduate  Occupational History   Occupation: retired  Tobacco Use   Smoking status: Never   Smokeless tobacco: Never  Vaping Use   Vaping Use: Never used  Substance and Sexual Activity   Alcohol use: Yes    Alcohol/week: 0.0 - 1.0 standard drinks of alcohol   Drug use: No   Sexual activity: Not on file  Other Topics Concern   Not on file  Social History Narrative   Not on file   Social Determinants of Health   Financial Resource Strain: Low Risk  (08/25/2022)   Overall Financial Resource Strain (CARDIA)    Difficulty of Paying Living Expenses: Not hard at all  Food Insecurity: No Food Insecurity (08/25/2022)   Hunger Vital Sign    Worried About Running Out of Food in the Last Year: Never true    Ran Out of Food in the Last Year: Never true  Transportation Needs: No Transportation Needs (08/25/2022)   PRAPARE - Hydrologist (Medical): No    Lack of Transportation (Non-Medical): No  Physical Activity: Sufficiently Active (08/25/2022)   Exercise Vital Sign    Days of Exercise per Week: 3 days    Minutes of Exercise per Session: 60 min  Stress: No Stress Concern Present (08/25/2022)   [place]    Feeling of Stress : Not at all  Social Connections: Moderately Integrated (08/25/2022)   Social Connection and Isolation Panel [NHANES]    Frequency of Communication with  Friends and Family: Twice a week    Frequency of Social Gatherings with Friends and Family: More than three times a week    Attends Religious Services: More than 4 times per year    Active Member of [employer] or Organizations: No    Attends Music therapist: Never    Marital Status: Married    Tobacco Counseling Counseling given: Not Answered   Clinical Intake:  Pre-visit preparation completed: Yes  Pain : No/denies pain     BMI - recorded: 29.11 Nutritional Status: BMI 25 -29 Overweight Nutritional Risks: None Diabetes: No  How often do you need to have someone help you when you read instructions, pamphlets, or other written materials from your doctor or pharmacy?: 1 - Never  Diabetic?NO  Interpreter Needed?: No  Comments: lives w/wife Information entered by :: [name],LPN   Activities of Daily Living    08/25/2022    9:35 AM 05/04/2022    9:48 AM  In your present state of health, do you have any difficulty performing the following activities:  Hearing? 0 0  Vision? 0 0  Difficulty concentrating or making decisions? 0 1  Walking or climbing stairs? 0 0  Dressing or bathing? 0 0  Doing errands, shopping? 0 0  Preparing Food and eating ? N   Using the Toilet? N   In the past six months, have you accidently leaked urine? N   Do you have problems with loss of bowel control? N   Managing your Medications? N   Managing your Finances? N   Housekeeping or managing your Housekeeping? N     Patient Care Team: Gwyneth Sprout, FNP as PCP - General (Family Medicine) System, Provider Not In  Indicate any recent Medical Services you may have received from other than [facility] providers in the past year (date may be approximate).     Assessment:   This is a routine wellness examination for Nathaniel Baker.  Hearing/Vision screen Hearing Screening - Comments:: Adequate hearing Vision Screening - Comments:: Adequate vision after cataract surgery;readers only [facility]   Dietary issues and exercise activities discussed: Current Exercise Habits: Home exercise routine, Type of exercise: walking;strength training/weights;stretching, Time (Minutes): > 60, Frequency (Times/Week): 3, Weekly Exercise (Minutes/Week): 0, Intensity: Mild, Exercise limited by: None identified   Goals Addressed             This Visit's Progress    DIET - DECREASE SODA OR JUICE INTAKE   On track    Continue to work towards cutting out all soda in diet.      DIET - EAT MORE FRUITS AND VEGETABLES  On track      Depression Screen    08/25/2022    9:30 AM 05/04/2022    9:48 AM 08/12/2021    9:06 AM 04/16/2021   10:08 AM 02/21/2020    9:47 AM 03/06/2019   10:28 AM 09/06/2017   10:12 AM  PHQ 2/9 Scores  PHQ - 2 Score 0 1 0 2 0 0 1  PHQ- 9 Score  2    1 5     Fall Risk    08/25/2022    9:25 AM 05/04/2022    9:48 AM 08/12/2021    9:10 AM 02/21/2020    9:52 AM  Fall Risk   Falls in the past year? 0 0 0 0  Number falls in past yr: 0 0 0 0  Injury with Fall? 0 0 0 0  Risk for fall due to : No Fall Risks No Fall Risks No Fall Risks   Follow up Education provided;Falls prevention discussed Falls evaluation completed Falls evaluation completed     FALL RISK PREVENTION PERTAINING TO THE HOME:  Any stairs in or around the home? Yes  If so, are there any without handrails? Yes  Home free of loose throw rugs in walkways, pet beds, electrical cords, etc? Yes  Adequate lighting in your home to reduce risk of falls? Yes   ASSISTIVE DEVICES UTILIZED TO PREVENT FALLS:  Life alert? No  Use of a cane, walker or w/c? No  Grab bars in the bathroom? No  Shower chair or bench in shower? Yes  Elevated toilet seat or a handicapped toilet? No   TIMED UP AND GO:  Was the test performed? Yes .  Length of time to ambulate 10 feet: 8 sec.   Gait steady and fast without use of assistive device  Cognitive Function:        08/25/2022    9:37 AM 02/21/2020    9:57 AM  6CIT Screen   What Year? 0 points 0 points  What month? 0 points 0 points  What time? 0 points 0 points  Count back from 20 0 points 0 points  Months in reverse 0 points 0 points  Repeat phrase 2 points 0 points  Total Score 2 points 0 points    Immunizations Immunization History  Administered Date(s) Administered   Influenza Split 06/07/2011   Influenza,inj,Quad PF,6+ Mos 04/17/2013, 03/12/2014, 01/27/2016, 03/22/2017   Influenza-Unspecified 03/25/2015   PFIZER(Purple Top)SARS-COV-2 Vaccination 10/19/2019, 12/04/2019   Tdap 06/07/2011, 08/18/2019   Zoster Recombinat (Shingrix) 04/16/2021    TDAP status: Up to date  Flu Vaccine status: Declined, Education has been provided regarding the importance of this vaccine but patient still declined. Advised may receive this vaccine at local pharmacy or Health Dept. Aware to provide a copy of the vaccination record if obtained from local pharmacy or Health Dept. Verbalized acceptance and understanding.  Pneumococcal vaccine status: Up to date  Covid-19 vaccine status: Completed vaccines  Qualifies for Shingles Vaccine? Yes   Zostavax completed yes  Shingrix Completed?: Yes  Screening Tests Health Maintenance  Topic Date Due   Zoster Vaccines- Shingrix (2 of 2) 06/11/2021   COVID-19 Vaccine (3 - 2023-24 season) 01/29/2022   INFLUENZA VACCINE  08/29/2022 (Originally 12/29/2021)   Pneumonia Vaccine 40+ Years old (1 of 1 - PCV) 05/05/2023 (Originally 03/09/2020)   COLONOSCOPY (Pts 45-24yrs Insurance coverage will need to be confirmed)  06/05/2023   Medicare Annual Wellness (AWV)  08/25/2023   DTaP/Tdap/Td (3 - Td or Tdap) 08/17/2029  Hepatitis C Screening  Completed   HPV VACCINES  Aged Out    Health Maintenance  Health Maintenance Due  Topic Date Due   Zoster Vaccines- Shingrix (2 of 2) 06/11/2021   COVID-19 Vaccine (3 - 2023-24 season) 01/29/2022    Colorectal cancer screening: Type of screening: Colonoscopy. Completed yes. Repeat every  5 years  Lung Cancer Screening: (Low Dose CT Chest recommended if Age 10-80 years, 30 pack-year currently smoking OR have quit w/in 15years.) does not qualify.   Lung Cancer Screening Referral: no  Additional Screening:  Hepatitis C Screening: does not qualify; Completed yes  Vision Screening: Recommended annual ophthalmology exams for early detection of glaucoma and other disorders of the eye. Is the patient up to date with their annual eye exam?  Yes  Who is the provider or what is the name of the office in which the patient attends annual eye exams? [facility] If pt is not established with a provider, would they like to be referred to a provider to establish care? No .   Dental Screening: Recommended annual dental exams for proper oral hygiene  Community Resource Referral / Chronic Care Management: CRR required this visit?  No   CCM required this visit?  No      Plan:     I have personally reviewed and noted the following in the patient's chart:   Medical and social history Use of alcohol, tobacco or illicit drugs  Current medications and supplements including opioid prescriptions. Patient is not currently taking opioid prescriptions. Functional ability and status Nutritional status Physical activity Advanced directives List of other physicians Hospitalizations, surgeries, and ER visits in previous 12 months Vitals Screenings to include cognitive, depression, and falls Referrals and appointments  In addition, I have reviewed and discussed with patient certain preventive protocols, quality metrics, and best practice recommendations. A written personalized care plan for preventive services as well as general preventive health recommendations were provided to patient.     Roger Shelter, LPN   [id number]   Nurse Notes: pt is doing well. Advance paperwork given to pt for completion. Pt has no concerns or questions.

## 2022-11-02 ENCOUNTER — Encounter: Payer: Self-pay | Admitting: Family Medicine

## 2022-11-02 ENCOUNTER — Ambulatory Visit (INDEPENDENT_AMBULATORY_CARE_PROVIDER_SITE_OTHER): Payer: PPO | Admitting: Family Medicine

## 2022-11-02 VITALS — BP 158/82 | HR 78 | Ht 69.0 in | Wt 187.0 lb

## 2022-11-02 DIAGNOSIS — T466X5A Adverse effect of antihyperlipidemic and antiarteriosclerotic drugs, initial encounter: Secondary | ICD-10-CM

## 2022-11-02 DIAGNOSIS — E7800 Pure hypercholesterolemia, unspecified: Secondary | ICD-10-CM | POA: Diagnosis not present

## 2022-11-02 DIAGNOSIS — Z0000 Encounter for general adult medical examination without abnormal findings: Secondary | ICD-10-CM

## 2022-11-02 DIAGNOSIS — I10 Essential (primary) hypertension: Secondary | ICD-10-CM

## 2022-11-02 DIAGNOSIS — N400 Enlarged prostate without lower urinary tract symptoms: Secondary | ICD-10-CM

## 2022-11-02 DIAGNOSIS — S68012S Complete traumatic metacarpophalangeal amputation of left thumb, sequela: Secondary | ICD-10-CM

## 2022-11-02 DIAGNOSIS — M609 Myositis, unspecified: Secondary | ICD-10-CM

## 2022-11-02 DIAGNOSIS — R972 Elevated prostate specific antigen [PSA]: Secondary | ICD-10-CM

## 2022-11-02 NOTE — Assessment & Plan Note (Signed)
Trial of medication from 1/24-2/24; reports bilateral cramping and pain in his back near his kidneys. Stopped medication and side effects went away in 3 days. Prefers not to restart statin based medication d/t this complication. Repeat LP. Will advise use of zetia if HLD remains. The 10-year ASCVD risk score (Arnett DK, et al., 2019) is: 26.5%

## 2022-11-02 NOTE — Assessment & Plan Note (Signed)
Chronic elevation Previous f/b [place] at urology S/p bx and use of medication to assist with nocturia Denies current complaints Repeat PSA

## 2022-11-02 NOTE — Assessment & Plan Note (Signed)
Chronic, elevated Previously stable with weight loss noted s/p diet and exercise Continue to recommend home checks Goal <139/<89 Continue norvasc 10 mg daily

## 2022-11-02 NOTE — Assessment & Plan Note (Signed)
Chronic, previously elevated Trial of crestor earlier this Spring; noted myopathy complications and medication was self discontinued Repeat LP recommend diet low in saturated fat and regular exercise - 30 min at least 5 times per week

## 2022-11-02 NOTE — Assessment & Plan Note (Signed)
UTD on dental and vision- just got a new partial Things to do to keep yourself healthy  - Exercise at least 30-45 minutes a day, 3-4 days a week.  - Eat a low-fat diet with lots of fruits and vegetables, up to 7-9 servings per day.  - Seatbelts can save your life. Wear them always.  - Smoke detectors on every level of your home, check batteries every year.  - Eye Doctor - have an eye exam every 1-2 years  - Safe sex - if you may be exposed to STDs, use a condom.  - Alcohol -  If you drink, do it moderately, less than 2 drinks per day.  - Health Care Power of Attorney. Choose someone to speak for you if you are not able.  - Depression is common in our stressful world.If you're feeling down or losing interest in things you normally enjoy, please come in for a visit.  - Violence - If anyone is threatening or hurting you, please call immediately.

## 2022-11-02 NOTE — Assessment & Plan Note (Signed)
Chronic, stable Continued PSA elevated Repeat PSA Denies bowel or bladder changes

## 2022-11-02 NOTE — Progress Notes (Signed)
Complete physical exam   Patient: Nathaniel Baker   DOB: [birth year]   67 y.o. Male  MRN: [id number] Visit Date: 11/02/2022  Today's healthcare provider: Jacky Kindle, FNP  Re Introduced to nurse practitioner role and practice setting.  All questions answered.  Discussed provider/patient relationship and expectations.  Chief Complaint  Patient presents with   Annual Exam   Subjective    Nathaniel Baker is a 67 y.o. male who presents today for a complete physical exam.  He reports consuming a general diet. Home exercise routine includes calisthenics. He generally feels well. He reports sleeping well. He does have additional problems to discuss today.  HPI   Past Medical History:  Diagnosis Date   Depression    Hyperlipidemia    Hypertension    Thyroid disease    Past Surgical History:  Procedure Laterality Date   PROSTATE SURGERY  2012   Biopsy x2   THUMB AMPUTATION     TONSILLECTOMY  1965   Tumor Removed Left 05/2014   Social History   Socioeconomic History   Marital status: Married    Spouse name: Not on file   Number of children: 2   Years of education: Not on file   Highest education level: High school graduate  Occupational History   Occupation: retired  Tobacco Use   Smoking status: Never   Smokeless tobacco: Never  Vaping Use   Vaping Use: Never used  Substance and Sexual Activity   Alcohol use: Yes    Alcohol/week: 0.0 - 1.0 standard drinks of alcohol    Comment: occ   Drug use: No   Sexual activity: Not on file  Other Topics Concern   Not on file  Social History Narrative   Not on file   Social Determinants of Health   Financial Resource Strain: Low Risk  (08/25/2022)   Overall Financial Resource Strain (CARDIA)    Difficulty of Paying Living Expenses: Not hard at all  Food Insecurity: No Food Insecurity (08/25/2022)   Hunger Vital Sign    Worried About Running Out of Food in the Last Year: Never true    Ran Out of Food in the Last Year:  Never true  Transportation Needs: No Transportation Needs (08/25/2022)   PRAPARE - Administrator, Civil Service (Medical): No    Lack of Transportation (Non-Medical): No  Physical Activity: Sufficiently Active (08/25/2022)   Exercise Vital Sign    Days of Exercise per Week: 3 days    Minutes of Exercise per Session: 60 min  Stress: No Stress Concern Present (08/25/2022)   [employer] of Occupational Health - Occupational Stress Questionnaire    Feeling of Stress : Not at all  Social Connections: Moderately Integrated (08/25/2022)   Social Connection and Isolation Panel [NHANES]    Frequency of Communication with Friends and Family: Twice a week    Frequency of Social Gatherings with Friends and Family: More than three times a week    Attends Religious Services: More than 4 times per year    Active Member of [employer] or Organizations: No    Attends Banker Meetings: Never    Marital Status: Married  Catering manager Violence: Not At Risk (08/25/2022)   Humiliation, Afraid, Rape, and Kick questionnaire    Fear of Current or Ex-Partner: No    Emotionally Abused: No    Physically Abused: No    Sexually Abused: No   Family Status  Relation Name  Status   Sister  Alive   Mother  Deceased at age 50       cause of death was MI   Father  Deceased at age 82        cause of death was internal bleeding after surgery   Brother 1 Alive       Melanoma, prostate cancer   MGF  Deceased       Lung cancer. Heavy smoker   MGM  Deceased   Brother 2 Alive   Family History  Problem Relation Age of Onset   Diabetes Sister    Depression Sister    Heart attack Mother    Prostate cancer Brother    Melanoma Brother    Lung cancer Maternal Grandmother    Diabetes Brother    Allergies  Allergen Reactions   Yellow Jacket Venom     Patient Care Team: Jacky Kindle, FNP as PCP - General (Family Medicine) System, Provider Not In   Medications: Outpatient Medications  Prior to Visit  Medication Sig   acetaminophen (TYLENOL) 500 MG tablet Take 500 mg by mouth every 6 (six) hours as needed.   amLODipine (NORVASC) 10 MG tablet Take 1 tablet (10 mg total) by mouth daily.   metFORMIN (GLUCOPHAGE-XR) 750 MG 24 hr tablet Take 1 tablet (750 mg total) by mouth daily with breakfast.   PARoxetine (PAXIL) 10 MG tablet Take 1 tablet (10 mg total) by mouth daily.   [DISCONTINUED] rosuvastatin (CRESTOR) 40 MG tablet Take 1 tablet (40 mg total) by mouth daily. (Patient not taking: Reported on 08/25/2022)   No facility-administered medications prior to visit.    Review of Systems  Last CBC Lab Results  Component Value Date   WBC 7.3 06/03/2022   HGB 14.5 06/03/2022   HCT 45.0 06/03/2022   MCV 83 06/03/2022   MCH 26.8 06/03/2022   RDW 13.5 06/03/2022   PLT 278 06/03/2022   Last metabolic panel Lab Results  Component Value Date   GLUCOSE 95 06/03/2022   NA 141 06/03/2022   K 4.6 06/03/2022   CL 103 06/03/2022   CO2 23 06/03/2022   BUN 15 06/03/2022   CREATININE 1.14 06/03/2022   EGFR 70 06/03/2022   CALCIUM 9.6 06/03/2022   PROT 7.6 06/03/2022   ALBUMIN 4.4 06/03/2022   LABGLOB 3.2 06/03/2022   AGRATIO 1.4 06/03/2022   BILITOT 0.9 06/03/2022   ALKPHOS 53 06/03/2022   AST 20 06/03/2022   ALT 21 06/03/2022   ANIONGAP 10 08/17/2019   Last lipids Lab Results  Component Value Date   CHOL 265 (H) 06/03/2022   HDL 56 06/03/2022   LDLCALC 196 (H) 06/03/2022   TRIG 76 06/03/2022   CHOLHDL 4.7 06/03/2022   Last hemoglobin A1c Lab Results  Component Value Date   HGBA1C 5.8 (H) 06/03/2022   Last thyroid functions Lab Results  Component Value Date   TSH 0.762 06/03/2022   Last vitamin D No results found for: "25OHVITD2", "25OHVITD3", "VD25OH" Last vitamin B12 and Folate No results found for: "VITAMINB12", "FOLATE"    Objective    BP (!) 158/82   Pulse 78   Ht 5\' 9"  (1.753 m)   Wt 187 lb (84.8 kg)   SpO2 98%   BMI 27.62 kg/m  BP  Readings from Last 3 Encounters:  11/02/22 (!) 158/82  08/25/22 120/82  06/03/22 (!) 145/96   Wt Readings from Last 3 Encounters:  11/02/22 187 lb (84.8 kg)  08/25/22 197 lb 1.6  oz (89.4 kg)  06/03/22 202 lb (91.6 kg)   SpO2 Readings from Last 3 Encounters:  11/02/22 98%  05/04/22 99%  04/16/21 100%       Physical Exam Vitals and nursing note reviewed.  Constitutional:      General: He is awake. He is not in acute distress.    Appearance: Normal appearance. He is well-developed, well-groomed and overweight. He is not ill-appearing, toxic-appearing or diaphoretic.  HENT:     Head: Normocephalic and atraumatic.     Jaw: There is normal jaw occlusion. No trismus, tenderness, swelling or pain on movement.     Salivary Glands: Right salivary gland is not diffusely enlarged or tender. Left salivary gland is not diffusely enlarged or tender.     Right Ear: Hearing, tympanic membrane, ear canal and external ear normal. There is no impacted cerumen.     Left Ear: Hearing, tympanic membrane, ear canal and external ear normal. There is no impacted cerumen.     Nose: Nose normal. No congestion or rhinorrhea.     Right Turbinates: Not enlarged, swollen or pale.     Left Turbinates: Not enlarged, swollen or pale.     Right Sinus: No maxillary sinus tenderness or frontal sinus tenderness.     Left Sinus: No maxillary sinus tenderness or frontal sinus tenderness.     Mouth/Throat:     Lips: Pink.     Mouth: Mucous membranes are moist. No injury, lacerations, oral lesions or angioedema.     Pharynx: Oropharynx is clear. Uvula midline. No pharyngeal swelling, oropharyngeal exudate or posterior oropharyngeal erythema.     Tonsils: No tonsillar exudate or tonsillar abscesses.  Eyes:     General: Lids are normal. Vision grossly intact. Gaze aligned appropriately.        Right eye: No discharge.        Left eye: No discharge.     Extraocular Movements: Extraocular movements intact.      Conjunctiva/sclera: Conjunctivae normal.     Pupils: Pupils are equal, round, and reactive to light.  Neck:     Thyroid: No thyroid mass, thyromegaly or thyroid tenderness.     Vascular: No carotid bruit.     Trachea: Trachea normal. No tracheal tenderness.  Cardiovascular:     Rate and Rhythm: Normal rate and regular rhythm.     Pulses: Normal pulses.          Carotid pulses are 2+ on the right side and 2+ on the left side.      Radial pulses are 2+ on the right side and 2+ on the left side.       Femoral pulses are 2+ on the right side and 2+ on the left side.      Popliteal pulses are 2+ on the right side and 2+ on the left side.       Dorsalis pedis pulses are 2+ on the right side and 2+ on the left side.       Posterior tibial pulses are 2+ on the right side and 2+ on the left side.     Heart sounds: Normal heart sounds, S1 normal and S2 normal. No murmur heard.    No friction rub. No gallop.  Pulmonary:     Effort: Pulmonary effort is normal. No respiratory distress.     Breath sounds: Normal breath sounds and air entry. No stridor. No wheezing, rhonchi or rales.  Chest:     Chest wall: No tenderness.  Abdominal:  General: Abdomen is flat. Bowel sounds are normal. There is no distension.     Palpations: Abdomen is soft. There is no mass.     Tenderness: There is no abdominal tenderness. There is no guarding or rebound.     Hernia: No hernia is present.  Genitourinary:    Comments: Exam deferred; denies complaints Musculoskeletal:        General: Deformity present. No swelling, tenderness or signs of injury. Normal range of motion.     Cervical back: Normal range of motion and neck supple. No rigidity or tenderness.     Right lower leg: No edema.     Left lower leg: No edema.     Comments: L thumb amputation  Lymphadenopathy:     Cervical: No cervical adenopathy.     Right cervical: No superficial, deep or posterior cervical adenopathy.    Left cervical: No superficial,  deep or posterior cervical adenopathy.  Skin:    General: Skin is warm and dry.     Capillary Refill: Capillary refill takes less than 2 seconds.     Coloration: Skin is not jaundiced or pale.     Findings: No bruising, erythema, lesion or rash.  Neurological:     General: No focal deficit present.     Mental Status: He is alert and oriented to person, place, and time. Mental status is at baseline.     GCS: GCS eye subscore is 4. GCS verbal subscore is 5. GCS motor subscore is 6.     Sensory: Sensation is intact. No sensory deficit.     Motor: Motor function is intact. No weakness.     Coordination: Coordination is intact.     Gait: Gait is intact.  Psychiatric:        Attention and Perception: Attention and perception normal.        Mood and Affect: Mood and affect normal.        Speech: Speech normal.        Behavior: Behavior normal. Behavior is cooperative.        Thought Content: Thought content normal.        Cognition and Memory: Cognition normal.        Judgment: Judgment normal.     Last depression screening scores    11/02/2022    9:31 AM 08/25/2022    9:30 AM 05/04/2022    9:48 AM  PHQ 2/9 Scores  PHQ - 2 Score 0 0 1  PHQ- 9 Score 2  2   Last fall risk screening    11/02/2022    9:31 AM  Fall Risk   Falls in the past year? 0  Number falls in past yr: 0  Injury with Fall? 0   Last Audit-C alcohol use screening    11/02/2022    9:31 AM  Alcohol Use Disorder Test (AUDIT)  1. How often do you have a drink containing alcohol? 2  2. How many drinks containing alcohol do you have on a typical day when you are drinking? 0  3. How often do you have six or more drinks on one occasion? 1  AUDIT-C Score 3   A score of 3 or more in women, and 4 or more in men indicates increased risk for alcohol abuse, EXCEPT if all of the points are from question 1   No results found for any visits on 11/02/22.  Assessment & Plan    Routine Health Maintenance and Physical  Exam  Exercise Activities  and Dietary recommendations  Goals      DIET - DECREASE SODA OR JUICE INTAKE     Continue to work towards cutting out all soda in diet.      DIET - EAT MORE FRUITS AND VEGETABLES        Immunization History  Administered Date(s) Administered   Influenza Split 06/07/2011   Influenza,inj,Quad PF,6+ Mos 04/17/2013, 03/12/2014, 01/27/2016, 03/22/2017   Influenza-Unspecified 03/25/2015   PFIZER(Purple Top)SARS-COV-2 Vaccination 10/19/2019, 12/04/2019   Tdap 06/07/2011, 08/18/2019   Zoster Recombinat (Shingrix) 04/16/2021   Zoster, Unspecified 01/29/2022    Health Maintenance  Topic Date Due   COVID-19 Vaccine (3 - 2023-24 season) 01/29/2022   Pneumonia Vaccine 46+ Years old (1 of 1 - PCV) 05/05/2023 (Originally 03/09/2020)   INFLUENZA VACCINE  12/30/2022   Colonoscopy  06/05/2023   Medicare Annual Wellness (AWV)  08/25/2023   DTaP/Tdap/Td (3 - Td or Tdap) 08/17/2029   Hepatitis C Screening  Completed   Zoster Vaccines- Shingrix  Completed   HPV VACCINES  Aged Out    Discussed health benefits of physical activity, and encouraged him to engage in regular exercise appropriate for his age and condition.  Problem List Items Addressed This Visit       Cardiovascular and Mediastinum   Primary hypertension    Chronic, elevated Previously stable with weight loss noted s/p diet and exercise Continue to recommend home checks Goal <139/<89 Continue norvasc 10 mg daily      Relevant Orders   CBC with Differential/Platelet   Comprehensive metabolic panel     Musculoskeletal and Integument   Statin-induced myositis    Trial of medication from 1/24-2/24; reports bilateral cramping and pain in his back near his kidneys. Stopped medication and side effects went away in 3 days. Prefers not to restart statin based medication d/t this complication. Repeat LP. Will advise use of zetia if HLD remains. The 10-year ASCVD risk score (Arnett DK, et al., 2019) is:  26.5%         Genitourinary   Benign fibroma of prostate    Chronic, stable Continued PSA elevated Repeat PSA Denies bowel or bladder changes        Other   Amputation, traumatic, thumb, left, sequela (HCC)    Hx of woodworking accident; 2021, thumb was amputated and flap closure performed at [facility]      Annual physical exam - Primary    UTD on dental and vision- just got a new partial Things to do to keep yourself healthy  - Exercise at least 30-45 minutes a day, 3-4 days a week.  - Eat a low-fat diet with lots of fruits and vegetables, up to 7-9 servings per day.  - Seatbelts can save your life. Wear them always.  - Smoke detectors on every level of your home, check batteries every year.  - Eye Doctor - have an eye exam every 1-2 years  - Safe sex - if you may be exposed to STDs, use a condom.  - Alcohol -  If you drink, do it moderately, less than 2 drinks per day.  - Health Care Power of Attorney. Choose someone to speak for you if you are not able.  - Depression is common in our stressful world.If you're feeling down or losing interest in things you normally enjoy, please come in for a visit.  - Violence - If anyone is threatening or hurting you, please call immediately.       Relevant Orders   CBC  with Differential/Platelet   Comprehensive metabolic panel   TSH   Elevated PSA    Chronic elevation Previous f/b [place] at urology S/p bx and use of medication to assist with nocturia Denies current complaints Repeat PSA      Relevant Orders   PSA   Hypercholesterolemia without hypertriglyceridemia    Chronic, previously elevated Trial of crestor earlier this Spring; noted myopathy complications and medication was self discontinued Repeat LP recommend diet low in saturated fat and regular exercise - 30 min at least 5 times per week       Relevant Orders   Lipid panel   Return in about 1 year (around 11/02/2023) for annual examination.    Leilani Merl,  FNP, have reviewed all documentation for this visit. The documentation on 11/02/22 for the exam, diagnosis, procedures, and orders are all accurate and complete.  Jacky Kindle, FNP  [facility] [phone] (phone) [phone] (fax)  [facility]

## 2022-11-02 NOTE — Assessment & Plan Note (Signed)
Hx of woodworking accident; 2021, thumb was amputated and flap closure performed at [facility]

## 2022-11-03 ENCOUNTER — Other Ambulatory Visit: Payer: Self-pay | Admitting: Family Medicine

## 2022-11-03 LAB — COMPREHENSIVE METABOLIC PANEL
ALT: 11 IU/L (ref 0–44)
AST: 19 IU/L (ref 0–40)
Albumin/Globulin Ratio: 1.8 (ref 1.2–2.2)
Albumin: 4.7 g/dL (ref 3.9–4.9)
Alkaline Phosphatase: 54 IU/L (ref 44–121)
BUN/Creatinine Ratio: 12 (ref 10–24)
BUN: 13 mg/dL (ref 8–27)
Bilirubin Total: 0.8 mg/dL (ref 0.0–1.2)
CO2: 24 mmol/L (ref 20–29)
Calcium: 9.4 mg/dL (ref 8.6–10.2)
Chloride: 103 mmol/L (ref 96–106)
Creatinine, Ser: 1.06 mg/dL (ref 0.76–1.27)
Globulin, Total: 2.6 g/dL (ref 1.5–4.5)
Glucose: 97 mg/dL (ref 70–99)
Potassium: 4.4 mmol/L (ref 3.5–5.2)
Sodium: 141 mmol/L (ref 134–144)
Total Protein: 7.3 g/dL (ref 6.0–8.5)
eGFR: 77 mL/min/{1.73_m2} (ref >59)

## 2022-11-03 LAB — LIPID PANEL
Chol/HDL Ratio: 4.3 ratio (ref 0.0–5.0)
Cholesterol, Total: 256 mg/dL — ABNORMAL HIGH (ref 100–199)
HDL: 59 mg/dL (ref >39)
LDL Chol Calc (NIH): 186 mg/dL — ABNORMAL HIGH (ref 0–99)
Triglycerides: 70 mg/dL (ref 0–149)
VLDL Cholesterol Cal: 11 mg/dL (ref 5–40)

## 2022-11-03 LAB — CBC WITH DIFFERENTIAL/PLATELET
Basophils Absolute: 0.1 10*3/uL (ref 0.0–0.2)
Basos: 1 %
EOS (ABSOLUTE): 0.3 10*3/uL (ref 0.0–0.4)
Eos: 4 %
Hematocrit: 43.4 % (ref 37.5–51.0)
Hemoglobin: 14.5 g/dL (ref 13.0–17.7)
Immature Grans (Abs): 0 10*3/uL (ref 0.0–0.1)
Immature Granulocytes: 1 %
Lymphocytes Absolute: 2.8 10*3/uL (ref 0.7–3.1)
Lymphs: 40 %
MCH: 27.8 pg (ref 26.6–33.0)
MCHC: 33.4 g/dL (ref 31.5–35.7)
MCV: 83 fL (ref 79–97)
Monocytes Absolute: 0.7 10*3/uL (ref 0.1–0.9)
Monocytes: 10 %
Neutrophils Absolute: 3.1 10*3/uL (ref 1.4–7.0)
Neutrophils: 44 %
Platelets: 279 10*3/uL (ref 150–450)
RBC: 5.21 x10E6/uL (ref 4.14–5.80)
RDW: 13.2 % (ref 11.6–15.4)
WBC: 7 10*3/uL (ref 3.4–10.8)

## 2022-11-03 LAB — TSH: TSH: 0.83 u[IU]/mL (ref 0.450–4.500)

## 2022-11-03 LAB — PSA: Prostate Specific Ag, Serum: 19.5 ng/mL — ABNORMAL HIGH (ref 0.0–4.0)

## 2022-11-03 MED ORDER — OMEGA-3-ACID ETHYL ESTERS 1 G PO CAPS: 2.0000 g | ORAL_CAPSULE | Freq: Two times a day (BID) | ORAL | 3 refills | Status: DC

## 2022-11-03 MED ORDER — EZETIMIBE 10 MG PO TABS: 10.0000 mg | ORAL_TABLET | Freq: Every day | ORAL | 3 refills | Status: DC

## 2022-11-03 NOTE — Progress Notes (Signed)
Cholesterol remains unchanged relatively with 1 month use of statin. Recommend start of Zetia 10 mg. I continue to recommend diet low in saturated fat and regular exercise - 30 min at least 5 times per week The 10-year ASCVD risk score (Arnett DK, et al., 2019) is: 25.3%. Stroke and MI risk remains 25% in 10 years.   Slight PSA increase; recommend consult with urology to re-evaluate previous prostate fibroma.   Other labs are normal and stable.

## 2022-12-28 ENCOUNTER — Encounter: Payer: Self-pay | Admitting: Family Medicine

## 2022-12-28 ENCOUNTER — Ambulatory Visit: Payer: PPO | Admitting: Family Medicine

## 2022-12-28 VITALS — BP 115/65 | HR 83 | Ht 69.0 in | Wt 190.0 lb

## 2022-12-28 DIAGNOSIS — E7800 Pure hypercholesterolemia, unspecified: Secondary | ICD-10-CM

## 2022-12-28 DIAGNOSIS — I10 Essential (primary) hypertension: Secondary | ICD-10-CM

## 2022-12-28 DIAGNOSIS — Z8249 Family history of ischemic heart disease and other diseases of the circulatory system: Secondary | ICD-10-CM | POA: Diagnosis not present

## 2022-12-28 DIAGNOSIS — I209 Angina pectoris, unspecified: Secondary | ICD-10-CM | POA: Insufficient documentation

## 2022-12-28 DIAGNOSIS — S68012S Complete traumatic metacarpophalangeal amputation of left thumb, sequela: Secondary | ICD-10-CM

## 2022-12-28 NOTE — Assessment & Plan Note (Signed)
Acute, now resolved reports ~10 days of complaints; first occurring 7/14 at church and continue throughout day Pt reports flushed feeling, c/o R shoulder pain and cold sweats Also noted inability to drive as well as syncope in his garage Did not tell his family; however, did pick up OTC ASA and has been taking Pt's mom had life ending MI EKG reviewed; new L block as well as concern for bifascicular block Last EKG showed ST with R BBB in 3/21 Recommend if s/s return; to call 911 or go to ER Also advised that cardiology referral is recommended given c/o CP at rest, ongoing Pt did take a COVID test and it was negative Denies exertion or concerns for low BG

## 2022-12-28 NOTE — Progress Notes (Addendum)
Established patient visit  Patient: Nathaniel Baker   DOB: May 03, 1956   67 y.o. Male  MRN: [id number] Visit Date: 12/28/2022  Today's healthcare provider: Jacky Kindle, FNP  Introduced to nurse practitioner role and practice setting.  All questions answered.  Discussed provider/patient relationship and expectations.  Subjective    HPI HPI     Medical Management of Chronic Issues    Additional comments: Patient reports to take medications as prescribed. Consuming a no additional salt diet. He reports this past weekend he felt flushed, sweaty, sob, pain in right shoulder down to arm then went into chest and reclined in chair for 2 days. Patient reports BP was high at the times of symptoms.       Last edited by Acey Lav, CMA on 12/28/2022  9:50 AM.     EKG: normal sinus rhythm, nonspecific ST and T waves changes, LBBB, RBBB, possible inferior block however, no significant ST elevation in II, III, or aVF.  Medications: Outpatient Medications Prior to Visit  Medication Sig   acetaminophen (TYLENOL) 500 MG tablet Take 500 mg by mouth every 6 (six) hours as needed.   amLODipine (NORVASC) 10 MG tablet Take 1 tablet (10 mg total) by mouth daily.   ezetimibe (ZETIA) 10 MG tablet Take 1 tablet (10 mg total) by mouth daily.   metFORMIN (GLUCOPHAGE-XR) 750 MG 24 hr tablet Take 1 tablet (750 mg total) by mouth daily with breakfast.   omega-3 acid ethyl esters (LOVAZA) 1 g capsule Take 2 capsules (2 g total) by mouth 2 (two) times daily. Take with/after meals.   PARoxetine (PAXIL) 10 MG tablet Take 1 tablet (10 mg total) by mouth daily.   No facility-administered medications prior to visit.    Review of Systems  Last CBC Lab Results  Component Value Date   WBC 7.0 11/02/2022   HGB 14.5 11/02/2022   HCT 43.4 11/02/2022   MCV 83 11/02/2022   MCH 27.8 11/02/2022   RDW 13.2 11/02/2022   PLT 279 11/02/2022   Last metabolic panel Lab Results  Component Value Date   GLUCOSE  97 11/02/2022   NA 141 11/02/2022   K 4.4 11/02/2022   CL 103 11/02/2022   CO2 24 11/02/2022   BUN 13 11/02/2022   CREATININE 1.06 11/02/2022   EGFR 77 11/02/2022   CALCIUM 9.4 11/02/2022   PROT 7.3 11/02/2022   ALBUMIN 4.7 11/02/2022   LABGLOB 2.6 11/02/2022   AGRATIO 1.8 11/02/2022   BILITOT 0.8 11/02/2022   ALKPHOS 54 11/02/2022   AST 19 11/02/2022   ALT 11 11/02/2022   ANIONGAP 10 08/17/2019   Last lipids Lab Results  Component Value Date   CHOL 256 (H) 11/02/2022   HDL 59 11/02/2022   LDLCALC 186 (H) 11/02/2022   TRIG 70 11/02/2022   CHOLHDL 4.3 11/02/2022   Last hemoglobin A1c Lab Results  Component Value Date   HGBA1C 5.8 (H) 06/03/2022   Last thyroid functions Lab Results  Component Value Date   TSH 0.830 11/02/2022      Objective    BP 115/65 (BP Location: Right Arm, Patient Position: Sitting, Cuff Size: Normal)   Pulse 83   Ht 5\' 9"  (1.753 m)   Wt 190 lb (86.2 kg)   SpO2 100%   BMI 28.06 kg/m  BP Readings from Last 3 Encounters:  12/28/22 115/65  11/02/22 (!) 158/82  08/25/22 120/82   Wt Readings from Last 3 Encounters:  12/28/22 190 lb (86.2 kg)  11/02/22 187 lb (84.8 kg)  08/25/22 197 lb 1.6 oz (89.4 kg)   SpO2 Readings from Last 3 Encounters:  12/28/22 100%  11/02/22 98%  05/04/22 99%   Physical Exam Vitals and nursing note reviewed.  Constitutional:      Appearance: Normal appearance. He is overweight.  HENT:     Head: Normocephalic and atraumatic.  Cardiovascular:     Rate and Rhythm: Normal rate and regular rhythm.     Pulses: Normal pulses.     Heart sounds: Normal heart sounds. No murmur heard.    No friction rub.  Pulmonary:     Effort: Pulmonary effort is normal.     Breath sounds: Normal breath sounds.  Musculoskeletal:        General: Normal range of motion.     Cervical back: Normal range of motion.  Skin:    General: Skin is warm and dry.     Capillary Refill: Capillary refill takes less than 2 seconds.   Neurological:     General: No focal deficit present.     Mental Status: He is alert and oriented to person, place, and time. Mental status is at baseline.  Psychiatric:        Mood and Affect: Mood normal.        Behavior: Behavior normal.        Thought Content: Thought content normal.        Judgment: Judgment normal.    No results found for any visits on 12/28/22.  Assessment & Plan     Problem List Items Addressed This Visit       Cardiovascular and Mediastinum   Angina pectoris (HCC) - Primary    Acute, now resolved reports ~10 days of complaints; first occurring 7/14 at church and continue throughout day Pt reports flushed feeling, c/o R shoulder pain and cold sweats Also noted inability to drive as well as syncope in his garage Did not tell his family; however, did pick up OTC ASA and has been taking Pt's mom had life ending MI EKG reviewed; new L block as well as concern for bifascicular block Last EKG showed ST with R BBB in 3/21 Recommend if s/s return; to call 911 or go to ER Also advised that cardiology referral is recommended given c/o CP at rest, ongoing Pt did take a COVID test and it was negative Denies exertion or concerns for low BG      Relevant Orders   Ambulatory referral to Cardiology   Primary hypertension    Chronic, improved Continue Norvasc at 10 mg At goal      Relevant Orders   Ambulatory referral to Cardiology     Other   Amputation, traumatic, thumb, left, sequela (HCC)    Chronic, stable Continue to monitor No deficits in self care reported      Hypercholesterolemia without hypertriglyceridemia    Hx of failed statins; continues on lovaza 2 cap BID as well as Zetia 10 mg daily The 10-year ASCVD risk score (Arnett DK, et al., 2019) is: 15.2%       Relevant Orders   Ambulatory referral to Cardiology   Other Visit Diagnoses     Family history of MI (myocardial infarction)       Relevant Orders   Ambulatory referral to  Cardiology      Return if symptoms worsen or fail to improve.     Leilani Merl, FNP, have reviewed all documentation for this visit. The documentation on  12/28/22 for the exam, diagnosis, procedures, and orders are all accurate and complete.  Addressed extensive list of chronic and acute medical problems today requiring 30 minutes reviewing his medical record, counseling patient regarding his conditions and coordination of care.    Jacky Kindle, FNP  [facility] [phone] (phone) [phone] (fax)  [facility]

## 2022-12-28 NOTE — Assessment & Plan Note (Signed)
Hx of failed statins; continues on lovaza 2 cap BID as well as Zetia 10 mg daily The 10-year ASCVD risk score (Arnett DK, et al., 2019) is: 15.2%

## 2022-12-28 NOTE — Assessment & Plan Note (Signed)
Chronic, improved Continue Norvasc at 10 mg At goal

## 2022-12-28 NOTE — Assessment & Plan Note (Signed)
Chronic, stable Continue to monitor No deficits in self care reported

## 2023-01-13 ENCOUNTER — Ambulatory Visit: Payer: PPO | Attending: Cardiology | Admitting: Cardiology

## 2023-01-13 ENCOUNTER — Encounter: Payer: Self-pay | Admitting: Cardiology

## 2023-01-13 VITALS — BP 138/84 | HR 77 | Ht 69.0 in | Wt 186.6 lb

## 2023-01-13 DIAGNOSIS — I209 Angina pectoris, unspecified: Secondary | ICD-10-CM | POA: Diagnosis not present

## 2023-01-13 DIAGNOSIS — E1169 Type 2 diabetes mellitus with other specified complication: Secondary | ICD-10-CM | POA: Diagnosis not present

## 2023-01-13 DIAGNOSIS — I10 Essential (primary) hypertension: Secondary | ICD-10-CM

## 2023-01-13 DIAGNOSIS — E785 Hyperlipidemia, unspecified: Secondary | ICD-10-CM

## 2023-01-13 NOTE — Assessment & Plan Note (Signed)
He did have some concerning symptoms but was only 1 episode although had a long period of time and has not had any since then.  It is possible that he may have had a myocardial event at that time but is also possible that he had some type of viral illness which made him feel poorly.  Cannot be sure, but certainly based on his risk factors with hypertension diabetes hyperlipidemia, we need to exclude CAD, and most notably a recent cardiac event.  Plan: Start with 2D echocardiogram to assess for any decreased function or wall motion abnormality. If no wall motion abnormality noted, would then proceed with ischemic evaluation with Coronary CTA and possible FFR ct. If abnormal findings noted on Coronary CTA FFR ct, would then proceed to cardiac catheterization If wall motion noted on echocardiogram we discussed proceeding directly to cardiac catheterization As he is currently not having additional symptoms, will hold off on treatment options besides continuing Norvasc.  Pending results of Coronary CTA with probably need to be more aggressive with treating lipids.  He is probably due to follow-up lipids checked with him being on Lovaza and Zetia, but I doubt that the LDL of 136 will improve without additional therapy.

## 2023-01-13 NOTE — Assessment & Plan Note (Signed)
Intolerant of atorvastatin.  Now on combination Zetia and Lovaza. Labs were checked in June and would probably be due for recheck in a month or 2.  Based on echocardiogram results, we will be doing either invasive or noninvasive ischemic evaluation with Coronary CTA and/or cardiac catheterization. => This will if nothing else give [place] a baseline assessment of his coronary artery disease burden and may increase the impetus to be more aggressive with lipid management. With the required lipid clinic assessment and consideration of PCSK9 inhibitors if intolerant of more than just atorvastatin

## 2023-01-13 NOTE — Progress Notes (Signed)
Cardiology Office Note:  .   Date:  01/13/2023  ID:  Nathaniel Baker, DOB 1956-03-11, MRN [id number] PCP: Nathaniel Kindle, FNP  [facility] Providers Cardiologist:  None     Chief Complaint  Patient presents with   New Patient (Initial Visit)    Referred for cardiac evaluation of hypertension.  3 weeks ago patient had an episode of feeling flushed, clammy, SOBr, right shoulder shooting pain down bicep, and cold sweat.  The next day was having right side/mid chest pain.     Chest Pain    History of Present Illness: .     Nathaniel Baker is a  67 y.o. male with a PMH notable for HTN, HLD & Fam Hx of CAD (mother died from MI) who presents here for Sx Concerning for Rest Angina at the request of Nathaniel Kindle, FNP.  Nathaniel Baker was recently seen on 12/28/22 by Ms. Suzie Portela, FNP for evaluation of Episodes of feeling flushed, sweaty, SOB with R shoulder pain -> to arm/chest.  Occurred @ rest.Also noted BP up @ times.  EKG: normal sinus rhythm, nonspecific ST and T waves changes, ? LBBB, RBBB, possible inferior block however, no significant ST elevation in II, III, or aVF.     Subjective  INTERVAL HISTORY Nathaniel Baker presents today at the request of his PCP for the episode that occurred about 3 weeks ago.  He describes the episode beginning on July 21.  This was a Sunday and he woke up prior to going to church healing just a little bit flushed and and abnormal just not quite right.  He ate something and got dressed and went to church.  After church she then decided to go to the store.  Upon arrival to store he felt really flushed and clammy.  He started noticing some discomfort in his right shoulder that radiated to his right bicep.  He felt very weak and tired and felt his legs no fall flat on his face.  He was able to finally get home and then he started feeling the discomfort going down to the right upper chest and this whole episode lasted about 30 minutes.  He did have some shortness  of breath but not significant at that time but by time he was able to get home he just felt extremely worn out and fatigued and over the course of this next 24 hours he started having some more notable discomfort in the right side of his chest.  He basely then spent 2 and half days sleeping in the recliner and hardly doing anything.  Barely able to eat.  He denied any nausea or vomiting after the initial onset of symptoms and was not overly dyspneic.  He slept much fine or more because that was the first place he got to not because of PND orthopnea.  He just felt overwhelmingly weak.  He spent the next 2 days praying on it and trying to figure out what to do.  He did not tell me what about his symptoms.  He said everything continued until overnight on that Tuesday when he slept he had a dream of being visited by an angel.  He then woke up that Wednesday morning and felt great. Since that Wed Am - feeling fine. Breathing fine, no more pain.  Less winded.  Energy fine. Feels as well as he has for 15 yrs.  Exercises 2-3 d / week with weights - but has not gotten back  to full amount - not wanting to push things until evaluation.   Prior to this episode, he had seen his PCP to discuss his lipids.  He has been on atorvastatin and was having what he thought were side effects symptoms.  He felt like he had myalgias and low back pain.  He was therefore switched to a lower dose of Trileptal atorvastatin and finally switched to Zetia and Lovaza.  Since making that switch, the back discomfort has improved.  ROS:  Cardiovascular ROS Since episode: no chest pain or dyspnea on exertion negative for - edema, irregular heartbeat, orthopnea, palpitations, paroxysmal nocturnal dyspnea, rapid heart rate, shortness of breath, or lightheaded, dizzy, woozy, syncope/near syncope. TIA/ amaurosis fugax  Review of Systems - Negative except Sx noted leading to visit.    Past Medical History:  Diagnosis Date   Depression     Hyperlipidemia    Hypertension    Thyroid disease    Past Surgical History:  Procedure Laterality Date   PROSTATE SURGERY  2012   Biopsy x2   THUMB AMPUTATION     TONSILLECTOMY  1965   Tumor Removed Left 05/2014   Social History   Tobacco Use   Smoking status: Never   Smokeless tobacco: Never  Vaping Use   Vaping status: Never Used  Substance Use Topics   Alcohol use: Yes    Alcohol/week: 0.0 - 1.0 standard drinks of alcohol    Comment: occ   Drug use: No    Family History family history includes Depression in his sister; Diabetes in his brother and sister; Heart attack in his mother; Lung cancer in his maternal grandmother; Melanoma in his brother; Prostate cancer in his brother.    Objective   Studies Reviewed: Marland Kitchen   EKG Interpretation Date/Time:  Thursday January 13 2023 09:49:37 EDT Ventricular Rate:  77 PR Interval:  172 QRS Duration:  150 QT Interval:  418 QTC Calculation: 473 R Axis:   -44  Text Interpretation: Normal sinus rhythm Left axis deviation Right bundle branch block Cannot rule out Inferior infarct , age undetermined When compared with ECG of 17-Aug-2019 19:02, No significant change since last tracing Confirmed by Bryan Lemma ([id number]) on 01/13/2023 10:07:33 AM   None  Risk Assessment/Calculations:           Physical Exam:   VS:  BP 138/84 (BP Location: Right Arm, Patient Position: Sitting, Cuff Size: Normal)   Pulse 77   Ht 5\' 9"  (1.753 m)   Wt 186 lb 9.6 oz (84.6 kg)   SpO2 99%   BMI 27.56 kg/m    Wt Readings from Last 3 Encounters:  01/13/23 186 lb 9.6 oz (84.6 kg)  12/28/22 190 lb (86.2 kg)  11/02/22 187 lb (84.8 kg)    GEN: Well nourished, well developed in no acute distress; healthy-appearing.  Well-groomed. NECK: No JVD; No carotid bruits CARDIAC: Normal S1, S2; RRR, no murmurs, rubs, gallops RESPIRATORY:  Clear to auscultation without rales, wheezing or rhonchi ; nonlabored, good air movement. ABDOMEN: Soft, non-tender,  non-distended EXTREMITIES:  No edema; No deformity   Lab Results  Component Value Date   CHOL 256 (H) 11/02/2022   HDL 59 11/02/2022   LDLCALC 186 (H) 11/02/2022   TRIG 70 11/02/2022   CHOLHDL 4.3 11/02/2022   Lab Results  Component Value Date   NA 141 11/02/2022   CL 103 11/02/2022   K 4.4 11/02/2022   CO2 24 11/02/2022   BUN 13 11/02/2022   CREATININE 1.06  11/02/2022   EGFR 77 11/02/2022   CALCIUM 9.4 11/02/2022   ALBUMIN 4.7 11/02/2022   GLUCOSE 97 11/02/2022     ASSESSMENT AND PLAN: .    Problem List Items Addressed This Visit       Cardiology Problems   Primary hypertension (Chronic)    Pressure is a little bit higher today than they usually are for him.  He is on Norvasc.  Would monitor.  Still, borderline and may need to have additional therapy with which would probably do best with an ARB based on his diabetes.      Relevant Orders   EKG 12-Lead (Completed)   Hyperlipidemia associated with type 2 diabetes mellitus (HCC) (Chronic)    Intolerant of atorvastatin.  Now on combination Zetia and Lovaza. Labs were checked in June and would probably be due for recheck in a month or 2.  Based on echocardiogram results, we will be doing either invasive or noninvasive ischemic evaluation with Coronary CTA and/or cardiac catheterization. => This will if nothing else give [place] a baseline assessment of his coronary artery disease burden and may increase the impetus to be more aggressive with lipid management. With the required lipid clinic assessment and consideration of PCSK9 inhibitors if intolerant of more than just atorvastatin      Angina pectoris (HCC) - Primary    He did have some concerning symptoms but was only 1 episode although had a long period of time and has not had any since then.  It is possible that he may have had a myocardial event at that time but is also possible that he had some type of viral illness which made him feel poorly.  Cannot be sure, but  certainly based on his risk factors with hypertension diabetes hyperlipidemia, we need to exclude CAD, and most notably a recent cardiac event.  Plan: Start with 2D echocardiogram to assess for any decreased function or wall motion abnormality. If no wall motion abnormality noted, would then proceed with ischemic evaluation with Coronary CTA and possible FFR ct. If abnormal findings noted on Coronary CTA FFR ct, would then proceed to cardiac catheterization If wall motion noted on echocardiogram we discussed proceeding directly to cardiac catheterization As he is currently not having additional symptoms, will hold off on treatment options besides continuing Norvasc.  Pending results of Coronary CTA with probably need to be more aggressive with treating lipids.  He is probably due to follow-up lipids checked with him being on Lovaza and Zetia, but I doubt that the LDL of 136 will improve without additional therapy.      Relevant Orders   ECHOCARDIOGRAM COMPLETE        Informed Consent   Shared Decision Making/Informed Consent The risks [stroke (1 in 1000), death (1 in 1000), kidney failure [usually temporary] (1 in 500), bleeding (1 in 200), allergic reaction [possibly serious] (1 in 200)], benefits (diagnostic support and management of coronary artery disease) and alternatives of a cardiac catheterization were discussed in detail with Nathaniel Baker and he is willing to proceed.      Dispo: No follow-ups on file.  Total time spent: 20 min spent with patient + 21 min spent charting = 41 min    Signed, Marykay Lex, MD, MS Bryan Lemma, M.D., M.S. Interventional Cardiologist  [facility]  Pager # [phone] Phone # [phone] [address] [place], [place] [address]

## 2023-01-13 NOTE — Patient Instructions (Signed)
Medication Instructions:  Your physician recommends that you continue on your current medications as directed. Please refer to the Current Medication list given to you today.  *If you need a refill on your cardiac medications before your next appointment, please call your pharmacy*   Lab Work: none If you have labs (blood work) drawn today and your tests are completely normal, you will receive your results only by: MyChart Message (if you have MyChart) OR A paper copy in the mail If you have any lab test that is abnormal or we need to change your treatment, we will call you to review the results.   Testing/Procedures: Your physician has requested that you have an echocardiogram. Echocardiography is a painless test that uses sound waves to create images of your heart. It provides your doctor with information about the size and shape of your heart and how well your heart's chambers and valves are working.   You may receive an ultrasound enhancing agent through an IV if needed to better visualize your heart during the echo. This procedure takes approximately one hour.  There are no restrictions for this procedure.  This will take place at [address] [address], [place] [address]    Follow-Up: At [facility], you and your health needs are our priority.  As part of our continuing mission to provide you with exceptional heart care, we have created designated Provider Care Teams.  These Care Teams include your primary Cardiologist (physician) and Advanced Practice Providers (APPs -  Physician Assistants and Nurse Practitioners) who all work together to provide you with the care you need, when you need it.  We recommend signing up for the patient portal called "MyChart".  Sign up information is provided on this After Visit Summary.  MyChart is used to connect with patients for Virtual Visits (Telemedicine).  Patients are able to view lab/test results, encounter  notes, upcoming appointments, etc.  Non-urgent messages can be sent to your provider as well.   To learn more about what you can do with MyChart, go to [email or web address].    Your next appointment:   6 weeks  Provider:   Bryan Lemma, MD or Eula Listen, PA-C

## 2023-01-13 NOTE — Assessment & Plan Note (Signed)
Pressure is a little bit higher today than they usually are for him.  He is on Norvasc.  Would monitor.  Still, borderline and may need to have additional therapy with which would probably do best with an ARB based on his diabetes.

## 2023-01-26 ENCOUNTER — Other Ambulatory Visit: Payer: Self-pay | Admitting: Family Medicine

## 2023-01-26 ENCOUNTER — Encounter: Payer: Self-pay | Admitting: Family Medicine

## 2023-01-26 DIAGNOSIS — R7303 Prediabetes: Secondary | ICD-10-CM

## 2023-02-04 ENCOUNTER — Ambulatory Visit: Payer: PPO | Attending: Cardiology

## 2023-02-04 DIAGNOSIS — I209 Angina pectoris, unspecified: Secondary | ICD-10-CM

## 2023-02-04 LAB — ECHOCARDIOGRAM COMPLETE
AR max vel: 3.11 cm2
AV Area VTI: 3.24 cm2
AV Area mean vel: 3.06 cm2
AV Mean grad: 3 mmHg
AV Peak grad: 5.8 mmHg
Ao pk vel: 1.21 m/s
Area-P 1/2: 4.31 cm2
Calc EF: 47.9 %
S' Lateral: 4.1 cm
Single Plane A2C EF: 44.8 %
Single Plane A4C EF: 49.6 %

## 2023-02-04 MED ORDER — PERFLUTREN LIPID MICROSPHERE
1.0000 mL | INTRAVENOUS | Status: AC | PRN
Start: 2023-02-04 — End: 2023-02-04
  Administered 2023-02-04: 2 mL via INTRAVENOUS

## 2023-02-21 ENCOUNTER — Ambulatory Visit: Payer: Self-pay

## 2023-02-21 NOTE — Telephone Encounter (Signed)
Chief Complaint: Arm Weakness Symptoms: Left arm weakness Frequency: constant Pertinent Negatives: Patient denies injury, numbness, pain  Disposition: [] ED /[] Urgent Care (no appt availability in office) / [x] Appointment(In office/virtual)/ []  [facility]/ [] Home Care/ [] Refused Recommended Disposition /[] [facility]/ []  Follow-up with PCP Additional Notes: Patient states he has had left arm weakness for about a month now. Patient states he can barely left a 5 lb weight and hold his arm out in front of him without using the right arm for support. Patient denies injury to the arm and numbness. Patient states his hands feel fine. Patient unsure why he is experiencing weakness in his left arm. Care advise was given an patient has been scheduled to see PCP tomorrow at 0820.  Reason for Disposition  Arm pain is a chronic symptom (recurrent or ongoing AND present > 4 weeks)  Answer Assessment - Initial Assessment Questions 1. ONSET: "When did the pain start?"     One month ago  2. LOCATION: "Where is the pain located?"     Left side 3. PAIN: "How bad is the pain?" (Scale 1-10; or mild, moderate, severe)   - MILD (1-3): Doesn't interfere with normal activities.   - MODERATE (4-7): Interferes with normal activities (e.g., work or school) or awakens from sleep.   - SEVERE (8-10): Excruciating pain, unable to do any normal activities, unable to hold a cup of water.     Mild pain  4. WORK OR EXERCISE: "Has there been any recent work or exercise that involved this part of the body?"     Yes  5. CAUSE: "What do you think is causing the arm pain?"     It's not really pain just weakness  6. OTHER SYMPTOMS: "Do you have any other symptoms?" (e.g., neck pain, swelling, rash, fever, numbness, weakness)     Weakness  Protocols used: Arm Pain-A-AH

## 2023-02-21 NOTE — Progress Notes (Deleted)
Cardiology Office Note    Date:  02/21/2023   ID:  Nathaniel Baker, DOB [birth year], MRN [id number]  PCP:  Jacky Kindle, FNP  Cardiologist:  Bryan Lemma, MD  Electrophysiologist:  None   Chief Complaint: Follow-up  History of Present Illness:   Nathaniel Baker is a 67 y.o. male with history of ***  ***   Labs independently reviewed: 10/2022 - TSH normal, TC 256, TG 70, HDL 59, LDL 186, BUN 13, serum creatinine 1.06, potassium 4.4, albumin 4.7, AST/ALT normal, Hgb 14.5, PLT 279 05/2022 - A1c 5.8  Past Medical History:  Diagnosis Date   Depression    Hyperlipidemia    Hypertension    Thyroid disease     Past Surgical History:  Procedure Laterality Date   PROSTATE SURGERY  2012   Biopsy x2   THUMB AMPUTATION     TONSILLECTOMY  1965   Tumor Removed Left 05/2014    Current Medications: No outpatient medications have been marked as taking for the 02/24/23 encounter (Appointment) with Sondra Barges, PA-C.    Allergies:   Yellow jacket venom   Social History   Socioeconomic History   Marital status: Married    Spouse name: Not on file   Number of children: 2   Years of education: Not on file   Highest education level: 12th grade  Occupational History   Occupation: retired  Tobacco Use   Smoking status: Never   Smokeless tobacco: Never  Vaping Use   Vaping status: Never Used  Substance and Sexual Activity   Alcohol use: Yes    Alcohol/week: 0.0 - 1.0 standard drinks of alcohol    Comment: occ   Drug use: No   Sexual activity: Not on file  Other Topics Concern   Not on file  Social History Narrative   Not on file   Social Determinants of Health   Financial Resource Strain: Low Risk  (02/21/2023)   Overall Financial Resource Strain (CARDIA)    Difficulty of Paying Living Expenses: Not hard at all  Food Insecurity: No Food Insecurity (02/21/2023)   Hunger Vital Sign    Worried About Running Out of Food in the Last Year: Never true    Ran Out of Food in  the Last Year: Never true  Transportation Needs: No Transportation Needs (02/21/2023)   PRAPARE - Administrator, Civil Service (Medical): No    Lack of Transportation (Non-Medical): No  Physical Activity: Insufficiently Active (02/21/2023)   Exercise Vital Sign    Days of Exercise per Week: 2 days    Minutes of Exercise per Session: 60 min  Stress: No Stress Concern Present (02/21/2023)   [employer] of Occupational Health - Occupational Stress Questionnaire    Feeling of Stress : Only a little  Social Connections: Moderately Integrated (02/21/2023)   Social Connection and Isolation Panel [NHANES]    Frequency of Communication with Friends and Family: Once a week    Frequency of Social Gatherings with Friends and Family: Once a week    Attends Religious Services: More than 4 times per year    Active Member of [employer] or Organizations: Yes    Attends Engineer, structural: More than 4 times per year    Marital Status: Married     Family History:  The patient's family history includes Depression in his sister; Diabetes in his brother and sister; Heart attack in his mother; Lung cancer in his maternal grandmother; Melanoma in  his brother; Prostate cancer in his brother.  ROS:      EKGs/Labs/Other Studies Reviewed:    Studies reviewed were summarized above. The additional studies were reviewed today:  2D echo 02/04/2023: 1. Left ventricular ejection fraction, by estimation, is 50 to 55%. The  left ventricle has low normal function. The left ventricle has no regional  wall motion abnormalities. There is mild left ventricular hypertrophy.  Left ventricular diastolic  parameters were normal.   2. Right ventricular systolic function is normal. The right ventricular  size is normal.   3. The mitral valve is normal in structure. Mild mitral valve  regurgitation.   4. The aortic valve is tricuspid. Aortic valve regurgitation is not  visualized.   5. The inferior  vena cava is normal in size with <50% respiratory  variability, suggesting right atrial pressure of 8 mmHg.    EKG:  EKG is ordered today.  The EKG ordered today demonstrates ***  Recent Labs: 11/02/2022: ALT 11; BUN 13; Creatinine, Ser 1.06; Hemoglobin 14.5; Platelets 279; Potassium 4.4; Sodium 141; TSH 0.830  Recent Lipid Panel    Component Value Date/Time   CHOL 256 (H) 11/02/2022 0954   TRIG 70 11/02/2022 0954   HDL 59 11/02/2022 0954   CHOLHDL 4.3 11/02/2022 0954   LDLCALC 186 (H) 11/02/2022 0954    PHYSICAL EXAM:    VS:  There were no vitals taken for this visit.  BMI: There is no height or weight on file to calculate BMI.  Physical Exam  Wt Readings from Last 3 Encounters:  01/13/23 186 lb 9.6 oz (84.6 kg)  12/28/22 190 lb (86.2 kg)  11/02/22 187 lb (84.8 kg)     ASSESSMENT & PLAN:   ***   {Are you ordering a CV Procedure (e.g. stress test, cath, DCCV, TEE, etc)?   Press F2        :[id number]}     Disposition: F/u with Dr. Herbie Baltimore or an APP in ***.   Medication Adjustments/Labs and Tests Ordered: Current medicines are reviewed at length with the patient today.  Concerns regarding medicines are outlined above. Medication changes, Labs and Tests ordered today are summarized above and listed in the Patient Instructions accessible in Encounters.   Signed, Eula Listen, PA-C 02/21/2023 2:39 PM     [facility] [place] [address] [place], [place] [address] [phone]

## 2023-02-22 ENCOUNTER — Ambulatory Visit (INDEPENDENT_AMBULATORY_CARE_PROVIDER_SITE_OTHER): Payer: PPO | Admitting: Family Medicine

## 2023-02-22 ENCOUNTER — Encounter: Payer: Self-pay | Admitting: Family Medicine

## 2023-02-22 VITALS — BP 133/77 | HR 67 | Ht 69.0 in | Wt 189.9 lb

## 2023-02-22 DIAGNOSIS — Z1211 Encounter for screening for malignant neoplasm of colon: Secondary | ICD-10-CM | POA: Diagnosis not present

## 2023-02-22 DIAGNOSIS — I10 Essential (primary) hypertension: Secondary | ICD-10-CM | POA: Insufficient documentation

## 2023-02-22 DIAGNOSIS — E1159 Type 2 diabetes mellitus with other circulatory complications: Secondary | ICD-10-CM | POA: Diagnosis not present

## 2023-02-22 DIAGNOSIS — I152 Hypertension secondary to endocrine disorders: Secondary | ICD-10-CM

## 2023-02-22 DIAGNOSIS — M75112 Incomplete rotator cuff tear or rupture of left shoulder, not specified as traumatic: Secondary | ICD-10-CM | POA: Diagnosis not present

## 2023-02-22 DIAGNOSIS — S46212D Strain of muscle, fascia and tendon of other parts of biceps, left arm, subsequent encounter: Secondary | ICD-10-CM

## 2023-02-22 DIAGNOSIS — Z7984 Long term (current) use of oral hypoglycemic drugs: Secondary | ICD-10-CM

## 2023-02-22 DIAGNOSIS — S46212S Strain of muscle, fascia and tendon of other parts of biceps, left arm, sequela: Secondary | ICD-10-CM | POA: Insufficient documentation

## 2023-02-22 NOTE — Assessment & Plan Note (Signed)
Agreeable to cologuard to assist with colon cancer screening

## 2023-02-22 NOTE — Assessment & Plan Note (Signed)
Chronic, borderline Goal of 129/79 Continue on medications to assist including Norvasc 10 mg Continue to recommend weight reduction, low salt diet and regular exercise

## 2023-02-22 NOTE — Progress Notes (Signed)
Established patient visit   Patient: Nathaniel Baker   DOB: Sep 14, 1955   67 y.o. Male  MRN: [id number] Visit Date: 02/22/2023  Today's healthcare provider: Jacky Kindle, FNP  Introduced to nurse practitioner role and practice setting.  All questions answered.  Discussed provider/patient relationship and expectations.  Subjective    HPI HPI     Extremity Weakness    Additional comments: Left arm X 1 month. Mild pain with recent work/exercise involved using that arm. Patient reports  he can barely lift a 5 lb weight and hold his arm out in front of him without using the right arm for support. Patient denies injury to the arm and numbness. Patient states his hands feel fine. Patient unsure why he is experiencing weakness in his left arm. Limited range of motion in arm.       Last edited by Acey Lav, CMA on 02/22/2023  8:32 AM.      Medications: Outpatient Medications Prior to Visit  Medication Sig   acetaminophen (TYLENOL) 500 MG tablet Take 500 mg by mouth every 6 (six) hours as needed.   amLODipine (NORVASC) 10 MG tablet Take 1 tablet (10 mg total) by mouth daily.   ezetimibe (ZETIA) 10 MG tablet Take 1 tablet (10 mg total) by mouth daily.   metFORMIN (GLUCOPHAGE-XR) 750 MG 24 hr tablet Take 1 tablet (750 mg total) by mouth daily with breakfast.   omega-3 acid ethyl esters (LOVAZA) 1 g capsule Take 2 capsules (2 g total) by mouth 2 (two) times daily. Take with/after meals.   PARoxetine (PAXIL) 10 MG tablet Take 1 tablet (10 mg total) by mouth daily.   No facility-administered medications prior to visit.    Review of Systems      Objective    BP 133/77 (BP Location: Right Arm, Patient Position: Sitting, Cuff Size: Normal)   Pulse 67   Ht 5\' 9"  (1.753 m)   Wt 189 lb 14.4 oz (86.1 kg)   SpO2 100%   BMI 28.04 kg/m     Physical Exam Vitals and nursing note reviewed.  Constitutional:      Appearance: Normal appearance. He is overweight.  HENT:     Head:  Normocephalic and atraumatic.  Cardiovascular:     Rate and Rhythm: Normal rate and regular rhythm.     Pulses: Normal pulses.     Heart sounds: Normal heart sounds.  Pulmonary:     Effort: Pulmonary effort is normal.     Breath sounds: Normal breath sounds.  Musculoskeletal:        General: Signs of injury present.     Cervical back: Normal range of motion.     Comments: Limited AB/ and AD-duction of L arm   Skin:    General: Skin is warm and dry.     Capillary Refill: Capillary refill takes less than 2 seconds.  Neurological:     General: No focal deficit present.     Mental Status: He is alert and oriented to person, place, and time. Mental status is at baseline.     Motor: Weakness present.     Comments: Reports slightly weaker L grip strength   Psychiatric:        Mood and Affect: Mood normal.        Behavior: Behavior normal.        Thought Content: Thought content normal.        Judgment: Judgment normal.     No results found for  any visits on 02/22/23.  Assessment & Plan     Problem List Items Addressed This Visit       Cardiovascular and Mediastinum   Hypertension associated with diabetes (HCC)    Chronic, borderline Goal of 129/79 Continue on medications to assist including Norvasc 10 mg Continue to recommend weight reduction, low salt diet and regular exercise         Musculoskeletal and Integument   Biceps muscle tear, left, sequela - Primary    Poor curling efforts with free weights; has had to reduce weight from 18+ lbs to 3 lb weights; unable to perform 3x/week exercises given weakness  Does not remember a pop or a pull/strain event  Referral places       Relevant Orders   Ambulatory referral to Orthopedic Surgery   Nontraumatic incomplete tear of left rotator cuff    1+ month history of poor arm movement; unable to lift L arm over shoulder; also not able to apply deodorant without placing arm on towel rack or similar to assist with holding the  arm up. No shoulder pain or pressure; limited ROM anteriorly, distal and posteriorly Referral placed       Relevant Orders   Ambulatory referral to Orthopedic Surgery     Other   Screening for colon cancer    Agreeable to cologuard to assist with colon cancer screening       Relevant Orders   Cologuard   Return if symptoms worsen or fail to improve.     Leilani Merl, FNP, have reviewed all documentation for this visit. The documentation on 02/22/23 for the exam, diagnosis, procedures, and orders are all accurate and complete.  Jacky Kindle, FNP  [facility] [phone] (phone) [phone] (fax)  [facility]

## 2023-02-22 NOTE — Assessment & Plan Note (Signed)
Poor curling efforts with free weights; has had to reduce weight from 18+ lbs to 3 lb weights; unable to perform 3x/week exercises given weakness  Does not remember a pop or a pull/strain event  Referral places

## 2023-02-22 NOTE — Assessment & Plan Note (Signed)
1+ month history of poor arm movement; unable to lift L arm over shoulder; also not able to apply deodorant without placing arm on towel rack or similar to assist with holding the arm up. No shoulder pain or pressure; limited ROM anteriorly, distal and posteriorly Referral placed

## 2023-02-23 LAB — MICROALBUMIN / CREATININE URINE RATIO
Creatinine, Urine: 75.9 mg/dL
Microalb/Creat Ratio: 26 mg/g creat (ref 0–29)
Microalbumin, Urine: 19.4 ug/mL

## 2023-02-23 LAB — HEMOGLOBIN A1C
Est. average glucose Bld gHb Est-mCnc: 120 mg/dL
Hgb A1c MFr Bld: 5.8 % — ABNORMAL HIGH (ref 4.8–5.6)

## 2023-02-24 ENCOUNTER — Telehealth: Payer: Self-pay | Admitting: Cardiology

## 2023-02-24 ENCOUNTER — Ambulatory Visit: Payer: PPO | Admitting: Physician Assistant

## 2023-02-24 NOTE — Telephone Encounter (Signed)
Called patient to reschedule appointment for today(Ryan Dunn, PA out of the office). Patient asks if he needs to have a follow up visit. States he received a myChart message stating his echo was normal. Please call and advise.

## 2023-02-25 NOTE — Telephone Encounter (Signed)
Hi [name] -   He had the plan was for him to come in to be seen so we can probably arrange Coronary CTA if he is having symptoms.  Not sure how [name] is doing if he is not feeling up to it, we can maybe get Mr. Nathaniel Baker on my schedule for next Thursday.  DH

## 2023-03-02 LAB — HM DIABETES EYE EXAM

## 2023-03-04 LAB — COLOGUARD: COLOGUARD: NEGATIVE

## 2023-04-14 ENCOUNTER — Ambulatory Visit: Payer: PPO | Admitting: Cardiology

## 2023-05-01 ENCOUNTER — Other Ambulatory Visit: Payer: Self-pay | Admitting: Family Medicine

## 2023-05-01 DIAGNOSIS — F32A Depression, unspecified: Secondary | ICD-10-CM

## 2023-05-03 NOTE — Telephone Encounter (Signed)
Please advise 

## 2023-05-30 ENCOUNTER — Other Ambulatory Visit: Payer: Self-pay | Admitting: Family Medicine

## 2023-05-31 ENCOUNTER — Other Ambulatory Visit: Payer: Self-pay | Admitting: Family Medicine

## 2023-06-02 ENCOUNTER — Encounter: Payer: Self-pay | Admitting: Cardiology

## 2023-06-02 ENCOUNTER — Ambulatory Visit: Payer: PPO | Attending: Cardiology | Admitting: Cardiology

## 2023-06-02 VITALS — BP 158/64 | HR 102 | Ht 69.0 in | Wt 192.6 lb

## 2023-06-02 DIAGNOSIS — E785 Hyperlipidemia, unspecified: Secondary | ICD-10-CM

## 2023-06-02 DIAGNOSIS — M9903 Segmental and somatic dysfunction of lumbar region: Secondary | ICD-10-CM | POA: Diagnosis not present

## 2023-06-02 DIAGNOSIS — R079 Chest pain, unspecified: Secondary | ICD-10-CM

## 2023-06-02 DIAGNOSIS — E1169 Type 2 diabetes mellitus with other specified complication: Secondary | ICD-10-CM | POA: Diagnosis not present

## 2023-06-02 DIAGNOSIS — M9901 Segmental and somatic dysfunction of cervical region: Secondary | ICD-10-CM | POA: Diagnosis not present

## 2023-06-02 DIAGNOSIS — I209 Angina pectoris, unspecified: Secondary | ICD-10-CM

## 2023-06-02 DIAGNOSIS — I10 Essential (primary) hypertension: Secondary | ICD-10-CM | POA: Diagnosis not present

## 2023-06-02 DIAGNOSIS — M50122 Cervical disc disorder at C5-C6 level with radiculopathy: Secondary | ICD-10-CM | POA: Diagnosis not present

## 2023-06-02 DIAGNOSIS — M25512 Pain in left shoulder: Secondary | ICD-10-CM | POA: Diagnosis not present

## 2023-06-02 DIAGNOSIS — M9902 Segmental and somatic dysfunction of thoracic region: Secondary | ICD-10-CM | POA: Diagnosis not present

## 2023-06-02 MED ORDER — METOPROLOL TARTRATE 100 MG PO TABS: 100.0000 mg | ORAL_TABLET | Freq: Once | ORAL | 0 refills | Status: DC

## 2023-06-02 MED ORDER — LOSARTAN POTASSIUM 25 MG PO TABS: 12.5000 mg | ORAL_TABLET | Freq: Every day | ORAL | 3 refills | Status: DC

## 2023-06-02 NOTE — Patient Instructions (Signed)
 Medication Instructions:   START: losartan  (COZAAR ) 25 MG tablet - Take 0.5 tablets (12.5 mg total) by mouth daily  *If you need a refill on your cardiac medications before your next appointment, please call your pharmacy*  Lab Work: Your provider would like for you to have following labs drawn  Basic Metabolic Panel (BMET).   If you have labs (blood work) drawn today and your tests are completely normal, you will receive your results only by: MyChart Message (if you have MyChart) OR A paper copy in the mail If you have any lab test that is abnormal or we need to change your treatment, we will call you to review the results.  Testing/Procedures:   Your cardiac CT will be scheduled at one of the below locations:   [facility] [address] [place], [place] [address] [phone]  OR   [facility] [address] [place], [place] [address] [phone]  If scheduled at [facility] or [facility], please arrive 15 mins early for check-in and test prep.  There is spacious parking and easy access to the radiology department from the [facility] entrance. Please enter here and check-in with the desk attendant.   Please follow these instructions carefully (unless otherwise directed):  An IV will be required for this test and Nitroglycerin  will be given.  Hold all erectile dysfunction medications at least 3 days (72 hrs) prior to test. (Ie viagra, cialis, sildenafil, tadalafil, etc)   On the Night Before the Test: Be sure to Drink plenty of water. Do not consume any caffeinated/decaffeinated beverages or chocolate 12 hours prior to your test. Do not take any antihistamines 12 hours prior to your test.  On the Day of the Test: Drink plenty of water until 1 hour prior to the test. Do not eat any food 1 hour prior to test. You may take your regular  medications prior to the test.  Take metoprolol  (Lopressor ) two hours prior to test. If you take Furosemide/Hydrochlorothiazide/Spironolactone/Chlorthalidone, please HOLD on the morning of the test. Patients who wear a continuous glucose monitor MUST remove the device prior to scanning. FEMALES- please wear underwire-free bra if available, avoid dresses & tight clothing        After the Test: Drink plenty of water. After receiving IV contrast, you may experience a mild flushed feeling. This is normal. On occasion, you may experience a mild rash up to 24 hours after the test. This is not dangerous. If this occurs, you can take Benadryl 25 mg and increase your fluid intake. If you experience trouble breathing, this can be serious. If it is severe call 911 IMMEDIATELY. If it is mild, please call our office.  We will call to schedule your test 2-4 weeks out understanding that some insurance companies will need an authorization prior to the service being performed.   For more information and frequently asked questions, please visit our website : [email or web address]  For non-scheduling related questions, please contact the cardiac imaging nurse navigator should you have any questions/concerns: Cardiac Imaging Nurse Navigators Direct Office Dial: [phone]   For scheduling needs, including cancellations and rescheduling, please call Brittany, [phone].   Follow-Up: At [facility], you and your health needs are our priority.  As part of our continuing mission to provide you with exceptional heart care, we have created designated Provider Care Teams.  These Care Teams include your primary Cardiologist (physician) and Advanced  Practice Providers (APPs -  Physician Assistants and Nurse Practitioners) who all work together to provide you with the care you need, when you need it.  Your next appointment:   Follow-up after testing is completed  Provider:   Alm Clay,  MD or Tylene Lunch, NP

## 2023-06-02 NOTE — Progress Notes (Signed)
 Cardiology Office Note:  .   Date:  06/02/2023  ID:  [name], DOB 1955-12-19, MRN [id number] PCP: Donzella Lauraine SAILOR, DO  [facility] Providers Cardiologist:  Alm Clay, MD    History of Present Illness: .   Nathaniel Baker is a 68 y.o. male with a past medical history of hypertension, hyperlipidemia, family history of coronary artery disease, abnormal EKG, who is here today for follow-up.   Patient was initially evaluated in the clinic by Dr. Clay on 01/13/2023 after referral from his PCP after an episode that he had a discharge approximately 3 weeks prior.  He said he felt a little bit flushed and just not quite right.  He ate something contrast for discharge.  At discharge he decided to get it as well.  Upon arrival to the store he felt flushed and clammy.  Started noticing some discomfort in his right shoulder that radiated into his right bicep.  He felt very weak and tired and and felt his legs give way and he fell flat on his face.  He was able to finally get home and then he started having some discomfort going down the right upper chest no episode he stated lasted for approximately 30 minutes.  He had some associated shortness of breath but not significantly at the time.  Over the course of 24 hours after his initial episode he started having more notable discomfort to the right side of his chest.  Stated that he spent 2-1/2 days sleep in the recliner noncompliant with nothing.  He is also so weak that he was barely able to eat.  States that he predominance for several days and then when he woke up on Wednesday a.m. he had been feeling fine.  He also stated prior to his original episode he had seen his PCP to discuss his lipids.  He had previously been on atorvastatin and was having what he thought was side effects.  He had felt like he had myalgias with low back pain.  He was therefore switched to a lower dose of atorvastatin and finally switched to ezetimibe  and Lovaza .  Since  making that switch his back discomfort had improved.  He was scheduled for an echocardiogram.   He returns to clinic today with continued weakness in the left upper extremity. He has been followed by ortho and chiropractor.  He still feels that he had a heart attack earlier in the year.  He has been to self follow-up of his amlodipine  stating that his blood pressures have been well-controlled at home.  He cut his dose in 2-and then stopped taking it altogether.  Blood pressure has been stable with peaks have not been elevated and then back down since he has been on his current medications.  He continues to monitor and keep a log at home of his blood pressures as well.  He denies any current chest pain or shortness of breath but still continues to suffer from this weakness that he had since his initial event.  When he was last seen by Dr. Clay in clinic he was scheduled for an echocardiogram and to return to discuss coronary CTA.  Unfortunately his appointments had been changed since that time due to provider issues and if she had not have actual follow-up until today.  He denies any hospitalizations or emergency department visits but he has been to several specialties trying to figure out what is going on with his arm.  He stated he was told  he had severe stenosis C4-C5 and would likely require surgery at the end of the month.   ROS: 10 point review of system has been reviewed and considered negative except what is been listed in the HPI  Studies Reviewed: .       2D echo 02/04/2023 1. Left ventricular ejection fraction, by estimation, is 50 to 55%. The  left ventricle has low normal function. The left ventricle has no regional  wall motion abnormalities. There is mild left ventricular hypertrophy.  Left ventricular diastolic  parameters were normal.   2. Right ventricular systolic function is normal. The right ventricular  size is normal.   3. The mitral valve is normal in structure. Mild mitral  valve  regurgitation.   4. The aortic valve is tricuspid. Aortic valve regurgitation is not  visualized.   5. The inferior vena cava is normal in size with <50% respiratory  variability, suggesting right atrial pressure of 8 mmHg.   Risk Assessment/Calculations:     HYPERTENSION CONTROL Vitals:   06/02/23 1042 06/02/23 1100  BP: (!) 156/99 (!) 158/64    The patient's blood pressure is elevated above target today.  In order to address the patient's elevated BP: A new medication was prescribed today.          Physical Exam:   VS:  BP (!) 158/64 (BP Location: Right Arm, Patient Position: Sitting, Cuff Size: Normal)   Pulse (!) 102   Ht 5' 9 (1.753 m)   Wt 192 lb 9.6 oz (87.4 kg)   SpO2 99%   BMI 28.44 kg/m    Wt Readings from Last 3 Encounters:  06/02/23 192 lb 9.6 oz (87.4 kg)  02/22/23 189 lb 14.4 oz (86.1 kg)  01/13/23 186 lb 9.6 oz (84.6 kg)    GEN: Well nourished, well developed in no acute distress NECK: No JVD; No carotid bruits CARDIAC: RRR, no murmurs, rubs, gallops RESPIRATORY:  Clear to auscultation without rales, wheezing or rhonchi  ABDOMEN: Soft, non-tender, non-distended EXTREMITIES:  No edema; No deformity   ASSESSMENT AND PLAN: .   Hypertension with blood pressure today 156/99 and recheck of 158/64. He had recently weaned himself off of the previously prescribed amlodipine  he had been taking because his blood pressures were good. With his home Bp log with some elevated pressures he has been started on losartan  12.5 mg daily at home for some kidney protection for his diabetes as well.  He has been encouraged to continue to monitor his blood pressure at home as well.  Angina pectoralis with concerning symptoms and was 1 episode of the once again extended episode was over several days.  Patient stated that he thought that he had a heart attack.  He felt poorly and fatigued after.  He is continued with some weakness and discomfort to his left shoulder and arm.   He was recently evaluated in clinic on 01/13/2023 was scheduled for an echocardiogram with the expectation to proceed with a coronary CTA for continued ischemic evaluation.  Coronary CTA is being ordered today.  He has requested that his labs to be drawn this PCP office prior to his coronary CTA due to phlebotomist there being able to obtain his blood on the first stick. Recent echocardiogram completed showed LVEF 50-55%, no RWMA, mild LVH, mild mitral regurgitation.  Hyperlipidemia associate with type 2 diabetes.  Previously been intolerant of atorvastatin.  PCP changed to ezetimibe  and Lovaza .  His last LDL was 186.  Which is increased from 136  at his prior drawl.  After the results of his coronary CTA he will likely need additional therapy to improve his LDL.  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  Continues to follow with orthopedics. Last study revealed severe stenosis at C5-C6 likely requiring surgery. Pending surgery for the end of the month.  Preoperative cardiovascular examination completed today.  Clearance will be offered after coronary CTA is completed and there was no further cardiovascular testing that needs to be completed.       Dispo: Patient return to clinic to see MD/APP once testing is completed or sooner if needed for reevaluation of symptoms  Signed, Adewale Pucillo, NP

## 2023-06-06 DIAGNOSIS — M25512 Pain in left shoulder: Secondary | ICD-10-CM | POA: Diagnosis not present

## 2023-06-06 DIAGNOSIS — M9902 Segmental and somatic dysfunction of thoracic region: Secondary | ICD-10-CM | POA: Diagnosis not present

## 2023-06-06 DIAGNOSIS — M9901 Segmental and somatic dysfunction of cervical region: Secondary | ICD-10-CM | POA: Diagnosis not present

## 2023-06-06 DIAGNOSIS — M9903 Segmental and somatic dysfunction of lumbar region: Secondary | ICD-10-CM | POA: Diagnosis not present

## 2023-06-08 DIAGNOSIS — M25512 Pain in left shoulder: Secondary | ICD-10-CM | POA: Diagnosis not present

## 2023-06-08 DIAGNOSIS — M9902 Segmental and somatic dysfunction of thoracic region: Secondary | ICD-10-CM | POA: Diagnosis not present

## 2023-06-08 DIAGNOSIS — M9903 Segmental and somatic dysfunction of lumbar region: Secondary | ICD-10-CM | POA: Diagnosis not present

## 2023-06-08 DIAGNOSIS — M9901 Segmental and somatic dysfunction of cervical region: Secondary | ICD-10-CM | POA: Diagnosis not present

## 2023-06-09 ENCOUNTER — Ambulatory Visit: Admission: RE | Admit: 2023-06-09 | Payer: PPO | Source: Ambulatory Visit

## 2023-06-09 DIAGNOSIS — M9903 Segmental and somatic dysfunction of lumbar region: Secondary | ICD-10-CM | POA: Diagnosis not present

## 2023-06-09 DIAGNOSIS — M9901 Segmental and somatic dysfunction of cervical region: Secondary | ICD-10-CM | POA: Diagnosis not present

## 2023-06-09 DIAGNOSIS — M9902 Segmental and somatic dysfunction of thoracic region: Secondary | ICD-10-CM | POA: Diagnosis not present

## 2023-06-09 DIAGNOSIS — M25512 Pain in left shoulder: Secondary | ICD-10-CM | POA: Diagnosis not present

## 2023-06-14 DIAGNOSIS — M9902 Segmental and somatic dysfunction of thoracic region: Secondary | ICD-10-CM | POA: Diagnosis not present

## 2023-06-14 DIAGNOSIS — M9901 Segmental and somatic dysfunction of cervical region: Secondary | ICD-10-CM | POA: Diagnosis not present

## 2023-06-14 DIAGNOSIS — M25512 Pain in left shoulder: Secondary | ICD-10-CM | POA: Diagnosis not present

## 2023-06-14 DIAGNOSIS — M9903 Segmental and somatic dysfunction of lumbar region: Secondary | ICD-10-CM | POA: Diagnosis not present

## 2023-06-15 DIAGNOSIS — M9901 Segmental and somatic dysfunction of cervical region: Secondary | ICD-10-CM | POA: Diagnosis not present

## 2023-06-15 DIAGNOSIS — M25512 Pain in left shoulder: Secondary | ICD-10-CM | POA: Diagnosis not present

## 2023-06-15 DIAGNOSIS — M9902 Segmental and somatic dysfunction of thoracic region: Secondary | ICD-10-CM | POA: Diagnosis not present

## 2023-06-15 DIAGNOSIS — M9903 Segmental and somatic dysfunction of lumbar region: Secondary | ICD-10-CM | POA: Diagnosis not present

## 2023-06-16 DIAGNOSIS — R079 Chest pain, unspecified: Secondary | ICD-10-CM | POA: Diagnosis not present

## 2023-06-17 LAB — BASIC METABOLIC PANEL
BUN/Creatinine Ratio: 13 (ref 10–24)
BUN: 14 mg/dL (ref 8–27)
CO2: 21 mmol/L (ref 20–29)
Calcium: 9.7 mg/dL (ref 8.6–10.2)
Chloride: 104 mmol/L (ref 96–106)
Creatinine, Ser: 1.08 mg/dL (ref 0.76–1.27)
Glucose: 89 mg/dL (ref 70–99)
Potassium: 4.3 mmol/L (ref 3.5–5.2)
Sodium: 142 mmol/L (ref 134–144)
eGFR: 75 mL/min/{1.73_m2} (ref >59)

## 2023-06-17 NOTE — Progress Notes (Signed)
Kidney function and electrolytes remain stable. Continue current medication regimen without changes needed at this time.

## 2023-06-20 DIAGNOSIS — M5412 Radiculopathy, cervical region: Secondary | ICD-10-CM | POA: Diagnosis not present

## 2023-06-22 ENCOUNTER — Telehealth (HOSPITAL_COMMUNITY): Payer: Self-pay | Admitting: *Deleted

## 2023-06-22 NOTE — Telephone Encounter (Signed)
Patient returning call about his upcoming cardiac imaging study; pt verbalizes understanding of appt date/time, parking situation and where to check in, pre-test NPO status and medications ordered, and verified current allergies; name and call back number provided for further questions should they arise  Merle Prescott RN Navigator Cardiac Imaging [facility] [phone] office [phone] cell  Patient to take 100mg metoprolol tartrate two hours prior to his cardiac CT scan.  

## 2023-06-22 NOTE — Telephone Encounter (Signed)
Attempted to call patient regarding upcoming cardiac CT appointment. Left message on voicemail with name and callback number Hayley Sharpe RN Navigator Cardiac Imaging [facility] [phone] Office   

## 2023-06-23 ENCOUNTER — Ambulatory Visit
Admission: RE | Admit: 2023-06-23 | Discharge: 2023-06-23 | Disposition: A | Discharge status: Home / Self Care | Payer: PPO | Source: Ambulatory Visit | Attending: Cardiology | Admitting: Cardiology

## 2023-06-23 DIAGNOSIS — I2510 Atherosclerotic heart disease of native coronary artery without angina pectoris: Secondary | ICD-10-CM | POA: Diagnosis not present

## 2023-06-23 DIAGNOSIS — R079 Chest pain, unspecified: Secondary | ICD-10-CM | POA: Insufficient documentation

## 2023-06-23 MED ORDER — SODIUM CHLORIDE 0.9 % IV BOLUS
150.0000 mL | Freq: Once | INTRAVENOUS | Status: AC
  Administered 2023-06-23: 150 mL via INTRAVENOUS

## 2023-06-23 MED ORDER — DILTIAZEM HCL 25 MG/5ML IV SOLN
5.0000 mg | INTRAVENOUS | Status: DC | PRN
  Administered 2023-06-23: 5 mg via INTRAVENOUS

## 2023-06-23 MED ORDER — METOPROLOL TARTRATE 5 MG/5ML IV SOLN
10.0000 mg | Freq: Once | INTRAVENOUS | Status: AC
  Administered 2023-06-23: 10 mg via INTRAVENOUS

## 2023-06-23 MED ORDER — DILTIAZEM HCL 25 MG/5ML IV SOLN: 10.0000 mg | INTRAVENOUS | Status: DC | PRN

## 2023-06-23 MED ORDER — IOHEXOL 350 MG/ML SOLN
75.0000 mL | Freq: Once | INTRAVENOUS | Status: AC | PRN
  Administered 2023-06-23: 75 mL via INTRAVENOUS

## 2023-06-23 MED ORDER — NITROGLYCERIN 0.4 MG SL SUBL
0.8000 mg | SUBLINGUAL_TABLET | Freq: Once | SUBLINGUAL | Status: AC
Start: 2023-06-23 — End: 2023-06-23
  Administered 2023-06-23: 0.8 mg via SUBLINGUAL

## 2023-06-23 MED ORDER — METOPROLOL TARTRATE 5 MG/5ML IV SOLN
10.0000 mg | Freq: Once | INTRAVENOUS | Status: AC | PRN
Start: 2023-06-23 — End: 2023-06-23
  Administered 2023-06-23: 10 mg via INTRAVENOUS

## 2023-06-23 NOTE — Progress Notes (Signed)
Patient tolerated CT well. Drank water after. Vital signs stable encourage to drink water throughout day.Reasons explained and verbalized understanding. Ambulated steady gait.  

## 2023-06-24 ENCOUNTER — Telehealth: Payer: Self-pay | Admitting: Cardiology

## 2023-06-24 NOTE — Progress Notes (Signed)
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  It is also recommended to work on preventative therapy and risk factor modification.  LDL or bad cholesterol needs to be at goal of 70 or less and adequately controlling diabetes.

## 2023-06-24 NOTE — Telephone Encounter (Signed)
Results and clearance have been faxed to Dr. Noralyn Pick.

## 2023-06-24 NOTE — Telephone Encounter (Signed)
Pt is calling to f/u on finding out if he's cleared to have surgery on Tuesday and is requesting a callback at [phone]. Please advise

## 2023-06-24 NOTE — Telephone Encounter (Signed)
His coronary CTA is showing mild nonobstructive coronary artery disease, so he is able to undergo scheduled procedure at acceptable risk.

## 2023-06-24 NOTE — Telephone Encounter (Signed)
   Pre-operative Risk Assessment    Patient Name: Nathaniel Baker  DOB: 05-08-1956 MRN: [id number]   Date of last office visit: 06/02/23 Date of next office visit: n/a   Request for Surgical Clearance    Procedure:  Anterior Cervical Discectomy & Fusion C4-6  Date of Surgery:  Clearance 06/28/23                                Surgeon:  Dr. Bradley Ferris Group or Practice Name:  [facility]  Phone number:  [phone] Fax number:  [phone]   Type of Clearance Requested:   - Medical    Type of Anesthesia:  Not Indicated   Additional requests/questions:    Signed, Narda Amber   06/24/2023, 8:10 AM

## 2023-06-28 DIAGNOSIS — Z9889 Other specified postprocedural states: Secondary | ICD-10-CM | POA: Diagnosis not present

## 2023-06-28 DIAGNOSIS — M5412 Radiculopathy, cervical region: Secondary | ICD-10-CM | POA: Diagnosis not present

## 2023-06-28 DIAGNOSIS — E785 Hyperlipidemia, unspecified: Secondary | ICD-10-CM | POA: Diagnosis not present

## 2023-06-28 DIAGNOSIS — Z89012 Acquired absence of left thumb: Secondary | ICD-10-CM | POA: Diagnosis not present

## 2023-06-28 DIAGNOSIS — Z9841 Cataract extraction status, right eye: Secondary | ICD-10-CM | POA: Diagnosis not present

## 2023-06-28 DIAGNOSIS — Z7984 Long term (current) use of oral hypoglycemic drugs: Secondary | ICD-10-CM | POA: Diagnosis not present

## 2023-06-28 DIAGNOSIS — M75102 Unspecified rotator cuff tear or rupture of left shoulder, not specified as traumatic: Secondary | ICD-10-CM | POA: Diagnosis not present

## 2023-06-28 DIAGNOSIS — I10 Essential (primary) hypertension: Secondary | ICD-10-CM | POA: Diagnosis not present

## 2023-06-28 DIAGNOSIS — R7303 Prediabetes: Secondary | ICD-10-CM | POA: Diagnosis not present

## 2023-06-28 DIAGNOSIS — J302 Other seasonal allergic rhinitis: Secondary | ICD-10-CM | POA: Diagnosis not present

## 2023-06-28 DIAGNOSIS — M50122 Cervical disc disorder at C5-C6 level with radiculopathy: Secondary | ICD-10-CM | POA: Diagnosis not present

## 2023-06-28 DIAGNOSIS — I2510 Atherosclerotic heart disease of native coronary artery without angina pectoris: Secondary | ICD-10-CM | POA: Diagnosis not present

## 2023-06-28 DIAGNOSIS — M4802 Spinal stenosis, cervical region: Secondary | ICD-10-CM | POA: Diagnosis not present

## 2023-06-28 DIAGNOSIS — E119 Type 2 diabetes mellitus without complications: Secondary | ICD-10-CM | POA: Diagnosis not present

## 2023-06-28 DIAGNOSIS — Z9009 Acquired absence of other part of head and neck: Secondary | ICD-10-CM | POA: Diagnosis not present

## 2023-06-28 DIAGNOSIS — Z8679 Personal history of other diseases of the circulatory system: Secondary | ICD-10-CM | POA: Diagnosis not present

## 2023-06-28 DIAGNOSIS — M50121 Cervical disc disorder at C4-C5 level with radiculopathy: Secondary | ICD-10-CM | POA: Diagnosis not present

## 2023-06-28 DIAGNOSIS — Z87891 Personal history of nicotine dependence: Secondary | ICD-10-CM | POA: Diagnosis not present

## 2023-06-28 DIAGNOSIS — E7800 Pure hypercholesterolemia, unspecified: Secondary | ICD-10-CM | POA: Diagnosis not present

## 2023-06-28 DIAGNOSIS — Z79899 Other long term (current) drug therapy: Secondary | ICD-10-CM | POA: Diagnosis not present

## 2023-06-28 HISTORY — PX: CERVICAL SPINE SURGERY: SHX589

## 2023-06-30 ENCOUNTER — Ambulatory Visit: Admission: RE | Admit: 2023-06-30 | Payer: PPO | Source: Ambulatory Visit

## 2023-07-05 NOTE — Telephone Encounter (Signed)
 Looking at the results of the prior test that he had there was mention that there was no evidence of prior injury meaning likely there was no prior heart attack on the echocardiogram because there was no injury to the wall muscles of the heart. The coronary CTA shows some mild blockage but nothing that is occluded that has caused a heart attack. The preventive therapy to prevent progression of the disease process is what has been recommended unless there are symptoms of recurrent or unstable chest pain.

## 2023-07-05 NOTE — Telephone Encounter (Signed)
Results notes were done for both studies -- very reassuring.  DH

## 2023-07-12 DIAGNOSIS — M5412 Radiculopathy, cervical region: Secondary | ICD-10-CM | POA: Diagnosis not present

## 2023-08-08 DIAGNOSIS — Z4889 Encounter for other specified surgical aftercare: Secondary | ICD-10-CM | POA: Diagnosis not present

## 2023-08-31 ENCOUNTER — Telehealth: Payer: Self-pay | Admitting: Family Medicine

## 2023-08-31 NOTE — Telephone Encounter (Signed)
 Nathaniel Baker pharmacy faxed refill request for the following medications:  metFORMIN (GLUCOPHAGE-XR) 750 MG 24 hr tablet    Please advise

## 2023-08-31 NOTE — Telephone Encounter (Signed)
 Patient comments (entered 08/28/2023): Had fusion surgery can't swallow pills  Rx will not be filled

## 2023-09-05 ENCOUNTER — Telehealth: Payer: Self-pay | Admitting: Family Medicine

## 2023-09-05 NOTE — Telephone Encounter (Unsigned)
 Copied from CRM [id number]. Topic: Clinical - Medication Question >> Sep 05, 2023  3:43 PM Alessandra Bevels wrote: Reason for CRM: Patient is calling to ask does he need to take ezetimibe (ZETIA) 10 MG tablet [[id number]]. Read that cause demetia in eldery people. Right now cut it down to half. Plans to half the medication for 10 days and then stop the medication.

## 2023-09-05 NOTE — Telephone Encounter (Signed)
 Patient is calling back to report that he is no longer taken the medication. He no longer needs the medication. Patient had throat surgery and is unable to swallow the metFORMIN (GLUCOPHAGE-XR) 750 MG 24 hr tablet . Patient feels better also being off the medication. He did not request a refill.

## 2023-09-05 NOTE — Telephone Encounter (Signed)
 Nathaniel Baker pharmacy is requesting refill metFORMIN (GLUCOPHAGE-XR) 750 MG 24 hr tablet   Please advise

## 2023-09-07 NOTE — Telephone Encounter (Signed)
 LMTCB-ok for E2C2 to give message.

## 2023-09-08 NOTE — Telephone Encounter (Signed)
 Patient advised.

## 2023-09-23 DIAGNOSIS — Z4889 Encounter for other specified surgical aftercare: Secondary | ICD-10-CM | POA: Diagnosis not present

## 2023-11-04 ENCOUNTER — Encounter: Payer: PPO | Admitting: Family Medicine

## 2023-11-07 ENCOUNTER — Telehealth: Payer: Self-pay | Admitting: Family Medicine

## 2023-11-07 ENCOUNTER — Other Ambulatory Visit: Payer: Self-pay

## 2023-11-07 MED ORDER — EZETIMIBE 10 MG PO TABS: 10.0000 mg | ORAL_TABLET | Freq: Every day | ORAL | 0 refills | Status: DC

## 2023-11-07 NOTE — Telephone Encounter (Signed)
 [facility] is requesting refill ezetimibe  (ZETIA ) 10 MG tablet  Please advise

## 2024-02-22 NOTE — Progress Notes (Signed)
 This patient is appearing on a report for being at risk of failing the Glycemic Status Assessment in Diabetes measure this calendar year.   Last documented A1c 5.8% on 02/22/23  Last seen in clinic on 02/22/24. No follow up appointment scheduled at this time. Will route to admin team for rescheduling.  Stephaniemarie Stoffel E. Marsh, PharmD Clinical Pharmacist [facility] [phone]

## 2024-03-19 ENCOUNTER — Encounter: Payer: Self-pay | Admitting: Family Medicine

## 2024-03-19 ENCOUNTER — Ambulatory Visit: Admitting: Family Medicine

## 2024-03-19 VITALS — BP 146/90 | HR 65 | Temp 97.6°F | Ht 69.0 in | Wt 177.2 lb

## 2024-03-19 DIAGNOSIS — I10 Essential (primary) hypertension: Secondary | ICD-10-CM | POA: Diagnosis not present

## 2024-03-19 DIAGNOSIS — I7121 Aneurysm of the ascending aorta, without rupture: Secondary | ICD-10-CM

## 2024-03-19 DIAGNOSIS — G720 Drug-induced myopathy: Secondary | ICD-10-CM | POA: Diagnosis not present

## 2024-03-19 DIAGNOSIS — E782 Mixed hyperlipidemia: Secondary | ICD-10-CM

## 2024-03-19 DIAGNOSIS — R972 Elevated prostate specific antigen [PSA]: Secondary | ICD-10-CM

## 2024-03-19 DIAGNOSIS — N182 Chronic kidney disease, stage 2 (mild): Secondary | ICD-10-CM

## 2024-03-19 MED ORDER — OMEGA-3-ACID ETHYL ESTERS 1 G PO CAPS: 2.0000 g | ORAL_CAPSULE | Freq: Two times a day (BID) | ORAL | 3 refills | Status: AC

## 2024-03-19 MED ORDER — LOSARTAN POTASSIUM 25 MG PO TABS
25.0000 mg | ORAL_TABLET | Freq: Every day | ORAL | 3 refills | Status: AC
Start: 2024-03-19 — End: 2024-06-17

## 2024-03-19 NOTE — Assessment & Plan Note (Signed)
 Chronic, with significant elevation in 2024.  Patient willing to repeat blood level checked and consider referral to an alternative urologist if the level has increased. - Recheck PSA today

## 2024-03-19 NOTE — Assessment & Plan Note (Signed)
 Chronic, uncertain stability.  Recheck eGFR today.  Continue to optimize risk factors.  Discussed possibility of initiating SGLT2 inhibitor; defer for the time being per patient preference.

## 2024-03-19 NOTE — Assessment & Plan Note (Signed)
 Noted.  Previously trialed pravastatin  and rosuvastatin .

## 2024-03-19 NOTE — Assessment & Plan Note (Addendum)
 Chronic. Intolerant of statins. Stopped ezetimibe  and reports feels much better off of it;  declines further intervention.  Refilled Lovaza ; however, patient has been taking over-the-counter omega-3s. 06/23/2023 CTA coronary score was 334 and consistent with mild nonobstructive coronary artery disease.  - recheck lipid panel today.  - Refilled Lovaza  (patient may opt continue over-the-counter options in lieu of this depending on cost; clarify at next visit)

## 2024-03-19 NOTE — Assessment & Plan Note (Signed)
 Chronic, mildly elevated today.  Patient endorses recent stressors.  However, blood pressure is also mildly elevated in the 120s to 130s.  Discussed increasing losartan  to a full tablet, to which patient was agreeable. - Sent losartan  25 mg daily to patient's pharmacy. - Return for recheck in 4 weeks.

## 2024-03-19 NOTE — Progress Notes (Signed)
 Established patient visit   Patient: Nathaniel Baker   DOB: 05/31/1956   68 y.o. Male  MRN: [id number] Visit Date: 03/19/2024  Today's healthcare provider: LAURAINE LOISE BUOY, DO   Chief Complaint  Patient presents with   Transitions Of Care    This patient is appearing on a report for being at risk of failing the Glycemic Status Assessment in Diabetes measure this calendar year.  Expresses no concerns at this time.   Subjective    HPI Nathaniel Baker is a 68 year old male who presents with concerns about muscle weakness following double fusion surgery.  He experiences ongoing muscle weakness following a double fusion surgery for a herniated disc in the cervical spine. He is unable to lift his arm beyond a certain point and has a loss of strength. Before surgery, he could perform five curls with a two-pound weight, which improved to ten reps with a three-pound weight post-surgery. However, his strength has regressed to the pre-surgery level of two pounds and five reps. A small rotator cuff tear was identified by MRI, but it was not deemed significant enough to cause the strength loss. No definitive tear in the bicep was identified.  He monitors his blood pressure at home, with recent readings of 122/79 and 132/83. He notes that his blood pressure has been elevated due to stress from personal issues, including car troubles and home remodeling. He is currently taking losartan  12.5 mg daily.  He was previously labeled as diabetic but clarifies that he was only prediabetic and has since stopped taking metformin  due to adverse effects. He maintains a healthy diet, influenced by his wife, who focuses on health foods and high-quality vitamins.  He has a history of elevated prostate levels, with a PSA of 7.7 in 2017 and more recent levels of 18-19. He previously underwent multiple biopsies (3 sets of 12), which he found distressing, and is hesitant to pursue further urological evaluation.  No significant urinary symptoms.  He has a history of back issues, including a fractured back and arthritis, which led to his retirement. He manages his condition by avoiding heavy lifting and reports no significant back pain unless he overexerts himself.  He has previously experienced chest pain radiating to his right arm, which resolved spontaneously. He underwent an EKG and echocardiogram, which were unremarkable.  He is not currently taking ezetimibe  for cholesterol management, as he experienced adverse effects from statins and prefers to manage his health through diet and lifestyle changes. He takes omega-3 supplements as part of his regimen.  CTA coronary score showed CAC score 334 and ascending aortic aneurysm measuring 4.1 cm.       Medications: Outpatient Medications Prior to Visit  Medication Sig   [DISCONTINUED] losartan  (COZAAR ) 25 MG tablet Take 0.5 tablets (12.5 mg total) by mouth daily.   [DISCONTINUED] omega-3 acid ethyl esters (LOVAZA ) 1 g capsule Take 2 capsules (2 g total) by mouth 2 (two) times daily. Take with/after meals.   [DISCONTINUED] amLODipine  (NORVASC ) 10 MG tablet TAKE 1 TABLET BY MOUTH DAILY   [DISCONTINUED] ezetimibe  (ZETIA ) 10 MG tablet Take 1 tablet (10 mg total) by mouth daily.   [DISCONTINUED] metoprolol  tartrate (LOPRESSOR ) 100 MG tablet Take 1 tablet (100 mg total) by mouth once for 1 dose. 2 hours prior to CT   No facility-administered medications prior to visit.        Objective    BP (!) 146/90 (BP Location: Right Arm, Patient Position:  Sitting, Cuff Size: Normal)   Pulse 65   Temp 97.6 F (36.4 C) (Oral)   Ht 5' 9 (1.753 m)   Wt 177 lb 3.2 oz (80.4 kg)   SpO2 99%   BMI 26.17 kg/m     Physical Exam Vitals and nursing note reviewed.  Constitutional:      General: He is not in acute distress.    Appearance: Normal appearance.  HENT:     Head: Normocephalic and atraumatic.  Eyes:     General: No scleral icterus.     Conjunctiva/sclera: Conjunctivae normal.  Cardiovascular:     Rate and Rhythm: Normal rate.  Pulmonary:     Effort: Pulmonary effort is normal.  Neurological:     Mental Status: He is alert and oriented to person, place, and time. Mental status is at baseline.  Psychiatric:        Mood and Affect: Mood normal.        Behavior: Behavior normal.      No results found for any visits on 03/19/24.  Assessment & Plan    Essential hypertension Assessment & Plan: Chronic, mildly elevated today.  Patient endorses recent stressors.  However, blood pressure is also mildly elevated in the 120s to 130s.  Discussed increasing losartan  to a full tablet, to which patient was agreeable. - Sent losartan  25 mg daily to patient's pharmacy. - Return for recheck in 4 weeks.  Orders: -     Microalbumin / creatinine urine ratio -     Comprehensive metabolic panel with GFR -     Hemoglobin A1c -     Lipid panel -     Losartan  Potassium; Take 1 tablet (25 mg total) by mouth daily.  Dispense: 90 tablet; Refill: 3  Mixed hyperlipidemia Assessment & Plan: Chronic. Intolerant of statins. Stopped ezetimibe  and reports feels much better off of it;  declines further intervention.  Refilled Lovaza ; however, patient has been taking over-the-counter omega-3s. 06/23/2023 CTA coronary score was 334 and consistent with mild nonobstructive coronary artery disease.  - recheck lipid panel today.  - Refilled Lovaza  (patient may opt continue over-the-counter options in lieu of this depending on cost; clarify at next visit)  Orders: -     Omega-3-acid  Ethyl Esters; Take 2 capsules (2 g total) by mouth 2 (two) times daily. Take with/after meals.  Dispense: 360 capsule; Refill: 3  Statin myopathy Assessment & Plan: Noted.  Previously trialed pravastatin  and rosuvastatin .   Chronic kidney disease, stage 2 (mild) Assessment & Plan: Chronic, uncertain stability.  Recheck eGFR today.  Continue to optimize risk factors.   Discussed possibility of initiating SGLT2 inhibitor; defer for the time being per patient preference.   Elevated PSA Assessment & Plan: Chronic, with significant elevation in 2024.  Patient willing to repeat blood level checked and consider referral to an alternative urologist if the level has increased. - Recheck PSA today  Orders: -     PSA Total (Reflex To Free)  Aneurysm of ascending aorta without rupture Assessment & Plan: 06/23/2023 CTA coronary showed ascending aortic aneurysm measuring 4.1 cm.  Noted. No acute concerns.  Continue to monitor with annual recheck via CTA or MRA.      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 Initial improvement has regressed. - Follow up with spinal doctor on Friday for assessment and management.  Defer to specialist management.  Right shoulder small rotator cuff tear Small tear identified on MRI. Possible nerve  impingement contributing to weakness. - Consider rotator cuff surgery if symptoms persist. - Follows with orthopedics; defer to specialist management.    Return in about 4 weeks (around 04/16/2024) for CPE, Chronic f/u, and, in next 3 months, for mAWV with AWV nurse.      I discussed the assessment and treatment plan with the patient  The patient was provided an opportunity to ask questions and all were answered. The patient agreed with the plan and demonstrated an understanding of the instructions.   The patient was advised to call back or seek an in-person evaluation if the symptoms worsen or if the condition fails to improve as anticipated.    LAURAINE LOISE BUOY, DO  [facility] [phone] (phone) [phone] (fax)  [facility]

## 2024-03-19 NOTE — Assessment & Plan Note (Addendum)
 06/23/2023 CTA coronary showed ascending aortic aneurysm measuring 4.1 cm.  Noted. No acute concerns.  Continue to monitor with annual recheck via CTA or MRA.

## 2024-03-23 DIAGNOSIS — M5412 Radiculopathy, cervical region: Secondary | ICD-10-CM | POA: Diagnosis not present

## 2024-03-23 DIAGNOSIS — R972 Elevated prostate specific antigen [PSA]: Secondary | ICD-10-CM | POA: Diagnosis not present

## 2024-03-24 LAB — MICROALBUMIN / CREATININE URINE RATIO
Creatinine, Urine: 77.4 mg/dL
Microalb/Creat Ratio: 20 mg/g{creat} (ref 0–29)
Microalbumin, Urine: 15.6 ug/mL

## 2024-03-24 LAB — LIPID PANEL
Chol/HDL Ratio: 3.7 ratio (ref 0.0–5.0)
Cholesterol, Total: 229 mg/dL — ABNORMAL HIGH (ref 100–199)
HDL: 62 mg/dL (ref >39)
LDL Chol Calc (NIH): 158 mg/dL — ABNORMAL HIGH (ref 0–99)
Triglycerides: 55 mg/dL (ref 0–149)
VLDL Cholesterol Cal: 9 mg/dL (ref 5–40)

## 2024-03-24 LAB — COMPREHENSIVE METABOLIC PANEL WITH GFR
ALT: 23 IU/L (ref 0–44)
AST: 25 IU/L (ref 0–40)
Albumin: 4.1 g/dL (ref 3.9–4.9)
Alkaline Phosphatase: 57 IU/L (ref 47–123)
BUN/Creatinine Ratio: 15 (ref 10–24)
BUN: 15 mg/dL (ref 8–27)
Bilirubin Total: 1.2 mg/dL (ref 0.0–1.2)
CO2: 23 mmol/L (ref 20–29)
Calcium: 9.4 mg/dL (ref 8.6–10.2)
Chloride: 104 mmol/L (ref 96–106)
Creatinine, Ser: 1.01 mg/dL (ref 0.76–1.27)
Globulin, Total: 2.7 g/dL (ref 1.5–4.5)
Glucose: 91 mg/dL (ref 70–99)
Potassium: 4.9 mmol/L (ref 3.5–5.2)
Sodium: 141 mmol/L (ref 134–144)
Total Protein: 6.8 g/dL (ref 6.0–8.5)
eGFR: 81 mL/min/1.73 (ref >59)

## 2024-03-24 LAB — PSA TOTAL (REFLEX TO FREE): Prostate Specific Ag, Serum: 29.7 ng/mL — ABNORMAL HIGH (ref 0.0–4.0)

## 2024-03-24 LAB — HEMOGLOBIN A1C
Est. average glucose Bld gHb Est-mCnc: 123 mg/dL
Hgb A1c MFr Bld: 5.9 % — ABNORMAL HIGH (ref 4.8–5.6)

## 2024-03-29 NOTE — Progress Notes (Signed)
 XAI FRERKING                                          MRN: [id number]   03/29/2024   The [facility] Quality Team Specialist reviewed this patient medical record for the purposes of chart review for care gap closure. The following were reviewed: abstraction for care gap closure-glycemic status assessment.    [facility] Quality Team

## 2024-03-29 NOTE — Progress Notes (Signed)
 Nathaniel Baker                                          MRN: [id number]   03/29/2024   The [facility] Quality Team Specialist reviewed this patient medical record for the purposes of chart review for care gap closure. The following were reviewed: chart review for care gap closure-controlling blood pressure.    [facility] Quality Team

## 2024-04-06 ENCOUNTER — Ambulatory Visit: Payer: Self-pay | Admitting: Family Medicine

## 2024-04-06 DIAGNOSIS — R972 Elevated prostate specific antigen [PSA]: Secondary | ICD-10-CM

## 2024-04-09 DIAGNOSIS — M25512 Pain in left shoulder: Secondary | ICD-10-CM | POA: Diagnosis not present

## 2024-05-07 DIAGNOSIS — M25512 Pain in left shoulder: Secondary | ICD-10-CM | POA: Diagnosis not present

## 2024-05-18 ENCOUNTER — Other Ambulatory Visit: Payer: Self-pay | Admitting: Cardiology

## 2024-05-18 DIAGNOSIS — I10 Essential (primary) hypertension: Secondary | ICD-10-CM

## 2024-06-07 NOTE — Progress Notes (Signed)
 Nathaniel Baker                                          MRN: [id number]   06/07/2024   The [facility] Quality Team Specialist reviewed this patient medical record for the purposes of chart review for care gap closure. The following were reviewed: chart review for care gap closure-controlling blood pressure.    [facility] Quality Team

## 2024-06-09 ENCOUNTER — Other Ambulatory Visit: Payer: Self-pay | Admitting: Cardiology

## 2024-06-09 DIAGNOSIS — I10 Essential (primary) hypertension: Secondary | ICD-10-CM
